# Patient Record
Sex: Male | Born: 1980
Health system: Southern US, Community
[De-identification: ages and names within clinical notes are randomized; demographics above are authoritative.]

## PROBLEM LIST (undated history)

## (undated) DIAGNOSIS — M109 Gout, unspecified: Secondary | ICD-10-CM

## (undated) HISTORY — DX: Gout, unspecified: M10.9

---

## 2016-04-10 ENCOUNTER — Emergency Department
Admission: EM | Admit: 2016-04-10 | Discharge: 2016-04-10 | Disposition: A | Discharge status: Home / Self Care | Payer: BLUE CROSS/BLUE SHIELD | Attending: Emergency Medicine | Admitting: Emergency Medicine

## 2016-04-10 ENCOUNTER — Emergency Department: Payer: BLUE CROSS/BLUE SHIELD

## 2016-04-10 DIAGNOSIS — M79643 Pain in unspecified hand: Secondary | ICD-10-CM

## 2016-04-10 DIAGNOSIS — R519 Headache, unspecified: Secondary | ICD-10-CM

## 2016-04-10 DIAGNOSIS — Y929 Unspecified place or not applicable: Secondary | ICD-10-CM | POA: Insufficient documentation

## 2016-04-10 DIAGNOSIS — Y999 Unspecified external cause status: Secondary | ICD-10-CM | POA: Insufficient documentation

## 2016-04-10 DIAGNOSIS — R51 Headache: Secondary | ICD-10-CM | POA: Diagnosis present

## 2016-04-10 DIAGNOSIS — Y939 Activity, unspecified: Secondary | ICD-10-CM | POA: Diagnosis not present

## 2016-04-10 DIAGNOSIS — W0110XA Fall on same level from slipping, tripping and stumbling with subsequent striking against unspecified object, initial encounter: Secondary | ICD-10-CM | POA: Diagnosis not present

## 2016-04-10 DIAGNOSIS — J069 Acute upper respiratory infection, unspecified: Secondary | ICD-10-CM | POA: Diagnosis not present

## 2016-04-10 DIAGNOSIS — R112 Nausea with vomiting, unspecified: Secondary | ICD-10-CM | POA: Insufficient documentation

## 2016-04-10 DIAGNOSIS — R42 Dizziness and giddiness: Secondary | ICD-10-CM | POA: Insufficient documentation

## 2016-04-10 DIAGNOSIS — M25542 Pain in joints of left hand: Secondary | ICD-10-CM | POA: Insufficient documentation

## 2016-04-10 LAB — URINALYSIS COMPLETE WITH MICROSCOPIC (ARMC ONLY)
BILIRUBIN URINE: NEGATIVE
Bacteria, UA: NONE SEEN
GLUCOSE, UA: NEGATIVE mg/dL
Hgb urine dipstick: NEGATIVE
Ketones, ur: NEGATIVE mg/dL
LEUKOCYTES UA: NEGATIVE
Nitrite: NEGATIVE
Protein, ur: NEGATIVE mg/dL
Specific Gravity, Urine: 1.016 (ref 1.005–1.030)
pH: 6 (ref 5.0–8.0)

## 2016-04-10 LAB — CBC
HCT: 45 % (ref 40.0–52.0)
HEMOGLOBIN: 15.5 g/dL (ref 13.0–18.0)
MCH: 30.6 pg (ref 26.0–34.0)
MCHC: 34.4 g/dL (ref 32.0–36.0)
MCV: 88.9 fL (ref 80.0–100.0)
Platelets: 201 10*3/uL (ref 150–440)
RBC: 5.06 MIL/uL (ref 4.40–5.90)
RDW: 13.2 % (ref 11.5–14.5)
WBC: 7.5 10*3/uL (ref 3.8–10.6)

## 2016-04-10 LAB — BASIC METABOLIC PANEL
ANION GAP: 7 (ref 5–15)
BUN: 18 mg/dL (ref 6–20)
CALCIUM: 9.5 mg/dL (ref 8.9–10.3)
CO2: 27 mmol/L (ref 22–32)
CREATININE: 1.17 mg/dL (ref 0.61–1.24)
Chloride: 106 mmol/L (ref 101–111)
GFR calc Af Amer: >60 mL/min (ref >60)
GLUCOSE: 109 mg/dL — AB (ref 65–99)
Potassium: 3.9 mmol/L (ref 3.5–5.1)
Sodium: 140 mmol/L (ref 135–145)

## 2016-04-10 MED ORDER — ONDANSETRON HCL 4 MG/2ML IJ SOLN
4.0000 mg | Freq: Once | INTRAMUSCULAR | Status: AC
  Administered 2016-04-10: 4 mg via INTRAVENOUS
  Filled 2016-04-10: qty 2

## 2016-04-10 MED ORDER — OXYMETAZOLINE HCL 0.05 % NA SOLN
1.0000 | Freq: Once | NASAL | Status: AC
  Administered 2016-04-10: 1 via NASAL
  Filled 2016-04-10: qty 15

## 2016-04-10 MED ORDER — ONDANSETRON HCL 4 MG PO TABS: 4.0000 mg | ORAL_TABLET | Freq: Three times a day (TID) | ORAL | 0 refills | Status: DC | PRN

## 2016-04-10 MED ORDER — OXYCODONE-ACETAMINOPHEN 5-325 MG PO TABS
1.0000 | ORAL_TABLET | Freq: Once | ORAL | Status: AC
  Administered 2016-04-10: 1 via ORAL
  Filled 2016-04-10: qty 1

## 2016-04-10 MED ORDER — MECLIZINE HCL 25 MG PO TABS: 25.0000 mg | ORAL_TABLET | Freq: Three times a day (TID) | ORAL | 0 refills | Status: DC | PRN

## 2016-04-10 MED ORDER — MECLIZINE HCL 25 MG PO TABS
25.0000 mg | ORAL_TABLET | Freq: Once | ORAL | Status: AC
  Administered 2016-04-10: 25 mg via ORAL
  Filled 2016-04-10: qty 1

## 2016-04-10 MED ORDER — SODIUM CHLORIDE 0.9 % IV BOLUS (SEPSIS): 500.0000 mL | Freq: Once | INTRAVENOUS | Status: DC

## 2016-04-10 MED ORDER — NAPROXEN SODIUM 275 MG PO TABS: 275.0000 mg | ORAL_TABLET | Freq: Two times a day (BID) | ORAL | 2 refills | Status: AC

## 2016-04-10 MED ORDER — MORPHINE SULFATE (PF) 4 MG/ML IV SOLN: 4.0000 mg | Freq: Once | INTRAVENOUS | Status: DC

## 2016-04-10 MED ORDER — SODIUM CHLORIDE 0.9 % IV BOLUS (SEPSIS)
1000.0000 mL | Freq: Once | INTRAVENOUS | Status: AC
Start: 2016-04-10 — End: 2016-04-10
  Administered 2016-04-10: 1000 mL via INTRAVENOUS

## 2016-04-10 MED ORDER — ONDANSETRON 4 MG PO TBDP
4.0000 mg | ORAL_TABLET | Freq: Once | ORAL | Status: AC | PRN
  Administered 2016-04-10: 4 mg via ORAL
  Filled 2016-04-10: qty 1

## 2016-04-10 MED ORDER — ONDANSETRON HCL 4 MG/2ML IJ SOLN: 4.0000 mg | Freq: Once | INTRAMUSCULAR | Status: DC

## 2016-04-10 NOTE — ED Provider Notes (Addendum)
[facility] Emergency Department Provider Note  ____________________________________________   I have reviewed the triage vital signs and the nursing notes.   HISTORY  Chief Complaint Nausea; Dizziness; Hand Pain; and Headache    HPI Benjamin Mccarty is a 35 y.o. male who is healthy at baseline. He has had, he states, many different episodes of true vertigo in his life. Usually, URIs. Patient states he's been having a URI for the last few days. He has been coughing. He states he has a history of sinus infections and sinus headaches. He states he is having sinus drainage in his sinuses feel "plugged up". He has pressure behind his sinuses which he describes the headache. This is a same sinus headache he has had multiple times. The patient states that he has had no fever. However, he does have vertigo. He states this is a same vertigo is had more times and he is able to recall usually associated with URIs. He has never seen anyone for this. Usually goes away after a few days. Denies any close head injury. States the other day in the middle the night he got up to go to the bathroom when he fell. He did not pass out but he banged his hand on the ground. He also has been vomiting. He states he has a cough. Usually his cough is somewhat post tussive. He does not usually vomit unless she is coughing. He is not coughing anything up. He denies any chest pain or shortness of breath. He denies any focal neurologic deficit. He has taken nothing for his headache today. Not worst headache of life. Gradual in onset. Symptoms for 6 days. Normal bowel movements.  History reviewed. No pertinent past medical history.  There are no active problems to display for this patient.   History reviewed. No pertinent surgical history.  Prior to Admission medications   Not on File    Allergies Patient has no known allergies.  No family history on file.  Social History Social History   Substance Use Topics  . Smoking status: Not on file  . Smokeless tobacco: Never Used  . Alcohol use No    Review of Systems }Constitutional: No fever/chills Eyes: No visual changes. ENT: No sore throat. No stiff neck no neck pain Cardiovascular: Denies chest pain. Respiratory: Denies shortness of breath.Positive cough Gastrointestinal:   Positive vomiting.  No diarrhea.  No constipation. Genitourinary: Negative for dysuria. Musculoskeletal: Negative lower extremity swelling Skin: Negative for rash. Neurological: Negative for severe headaches, focal weakness or numbness. 10-point ROS otherwise negative.  ____________________________________________   PHYSICAL EXAM:  VITAL SIGNS: ED Triage Vitals  Enc Vitals Group     BP 04/10/16 1317 124/90     Pulse Rate 04/10/16 1317 81     Resp 04/10/16 1317 18     Temp 04/10/16 1317 98 F (36.7 C)     Temp Source 04/10/16 1317 Oral     SpO2 04/10/16 1317 96 %     Weight 04/10/16 1318 288 lb (130.6 kg)     Height 04/10/16 1318 6' (1.829 m)     Head Circumference --      Peak Flow --      Pain Score 04/10/16 1318 8     Pain Loc --      Pain Edu? --      Excl. in GC? --     Constitutional: Alert and oriented. Well appearing and in no acute distress. Eyes: Conjunctivae are normal. PERRL. EOMI.  Head: Atraumatic. Nose: Positive swollen turbinates and erythema with rhinorrhea. Positive tenderness to palpation to the sinuses bilaterally. Mouth/Throat: Mucous membranes are moist.  Oropharynx non-erythematous. Mild cobblestoning from postnasal drip noted. Otherwise oropharynx normal Neck: No stridor.   Nontender with no meningismus Cardiovascular: Normal rate, regular rhythm. Grossly normal heart sounds.  Good peripheral circulation. Respiratory: Normal respiratory effort.  No retractions. Lungs CTAB. Abdominal: Soft and nontender. No distention. No guarding no rebound Back:  There is no focal tenderness or step off.  there is no  midline tenderness there are no lesions noted. there is no CVA tenderness Musculoskeletal: No lower extremity tenderness, there is minimal ptosis palpation to the left eye with no evidence of fracture no evidence of tenderness to the snuffbox, full range of motion no evidence of ligamentous injury. No joint effusions, no DVT signs strong distal pulses no edema Neurologic:  Cranial nerves II through XII are grossly intact 5 out of 5 strength bilateral upper and lower extremity. Finger to nose within normal limits heel to shin within normal limits, speech is normal with no word finding difficulty or dysarthria, reflexes symmetric, pupils are equally round and reactive to light, there is no pronator drift, sensation is normal, vision is intact to confrontation, gait is deferred, there is no nystagmus, normal neurologic exam, positive nystagmus. Skin:  Skin is warm, dry and intact. No rash noted. Port wine stain noted to the patient's left face. Psychiatric: Mood and affect are normal. Speech and behavior are normal.  ____________________________________________   LABS (all labs ordered are listed, but only abnormal results are displayed)  Labs Reviewed  BASIC METABOLIC PANEL - Abnormal; Notable for the following:       Result Value   Glucose, Bld 109 (*)    All other components within normal limits  URINALYSIS COMPLETEWITH MICROSCOPIC ([facility] ONLY) - Abnormal; Notable for the following:    Color, Urine YELLOW (*)    APPearance CLEAR (*)    Squamous Epithelial / LPF 0-5 (*)    All other components within normal limits  CBC   ____________________________________________  EKG  I personally interpreted any EKGs ordered by me or triage Normal EKG, normal sinus rhythm rate 77 bpm no acute ST elevation or acute ST depression ____________________________________________  RADIOLOGY  I reviewed any imaging ordered by me or triage that were performed during my shift and, if possible, patient and/or  family made aware of any abnormal findings. ____________________________________________   PROCEDURES  Procedure(s) performed: None  Procedures  Critical Care performed: None  ____________________________________________   INITIAL IMPRESSION / ASSESSMENT AND PLAN / ED COURSE  Pertinent labs & imaging results that were available during my care of the patient were reviewed by me and considered in my medical decision making (see chart for details).  Patient with a host of different complaints today, the most compelling for him is that he is having vertigo symptoms. He's had this multiple times in his life. It is fatigable. It is clearly brought on by viral syndromes. He's had them before. He is neurologically intact aside from a mild nystagmus. We will give him IV fluids, I will check a CT only because he has had multiple episodes of vertigo without any imaging,, but my anticipation is that it'll be negative. His TMs are normal bilaterally. I do not think that his headache represents meningitis or bleed. Do not think lumbar puncture is indicated. Patient is very reproducible sinus pain in the context of sinus drainage. We will give him Afrin  to see if we can open that up. We will give him Antivert, and we will continue to observe him. He may have a sprain him with is no evidence of significant injury will recommend orthopedic follow-up for persistent pain of such exists.   ----------------------------------------- 6:17 PM on 04/10/2016 -----------------------------------------  Patient in no acute distress at this time and vertigo is resolved, feels 100% better requesting discharge neurologically intact at discharge, lungs are clear, workup is very reassuring from top to bottom. EKG is reassuring blood work is reassuring x-rays are reassuring blood work is reassuring vital signs are reassuring neurologic exam is reassuring symptoms are reassuring and improvement in symptoms is reassuring.  Patient eager to be discharged, will send him with Antivert, we will advise Tylenol for the headaches from the nasal congestion. He and I discussed lumbar puncture, he would prefer not to have it done and I do not think it is warranted at this time. Return precautions and follow-up given and understood.  Clinical Course    ____________________________________________   FINAL CLINICAL IMPRESSION(S) / ED DIAGNOSES  Final diagnoses:  None      This chart was dictated using voice recognition software.  Despite best efforts to proofread,  errors can occur which can change meaning.      Jeanmarie PlantJames A Badr Piedra, MD 04/10/16 1648    Jeanmarie PlantJames A Brant Peets, MD 04/10/16 [phone]

## 2016-04-10 NOTE — ED Notes (Addendum)
Pt states frontal HA x few days, vomiting x 6 times yesterday. States dizziness. Pt also fell recently and hurt L hand, states he is not worried about that at this time. Pt states lights bother him, states some blurred vision. Denies abd pain, denies diarrhea, denies fever. Pt has strong cough. Denies having prescription for HA and denies seeing neurologist.

## 2016-04-10 NOTE — ED Triage Notes (Signed)
Pt arrives to ER via POV c/o URI sx X 5 days, nausea and dizziness X 2 days and hand injury due to trip and fall last night to left hand. Pt alert and oriented X4, active, cooperative, pt in NAD. RR even and unlabored, color WNL.

## 2017-05-12 IMAGING — CT CT HEAD W/O CM
3 series · 15 of 46 positions shown, 18 images · non-contrast
Comparison: None.

CLINICAL DATA: 35 y/o  M; headache and vertigo.

EXAM:
CT HEAD WITHOUT CONTRAST
TECHNIQUE: Contiguous axial images were obtained from the base of the skull
through the vertex without intravenous contrast.

[Series 2: head wo · axial · 0.41mm/px · z∈[-104,+16]mm · 9 of 29 slices shown, 12 images]
[im 3/29  brain]
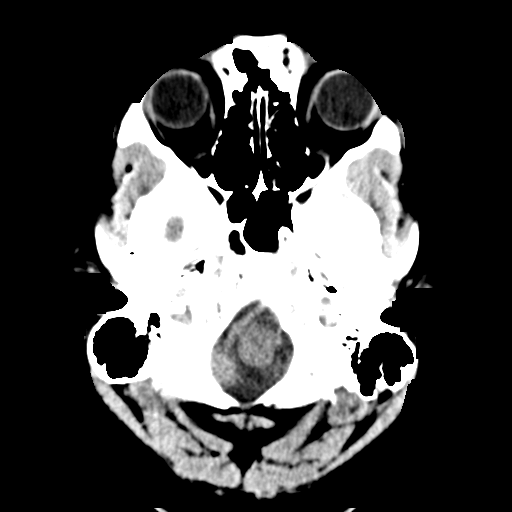
[im 3/29  bone]
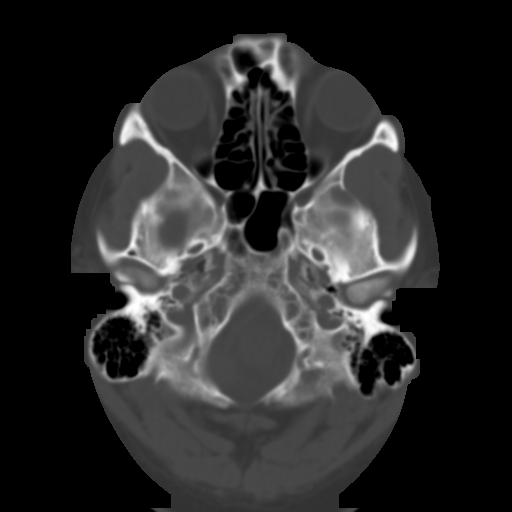
[im 6/29  brain]
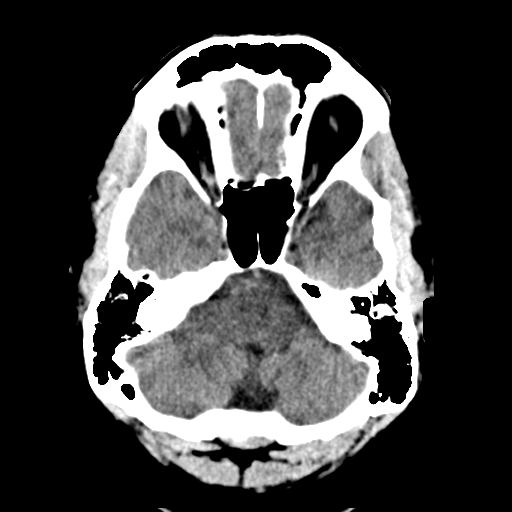
[im 9/29  brain]
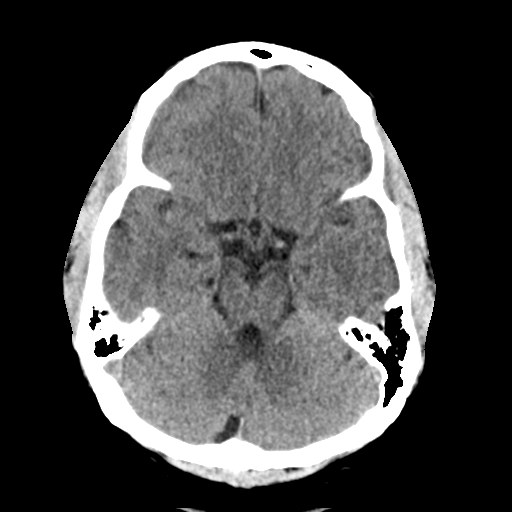
[im 12/29  brain]
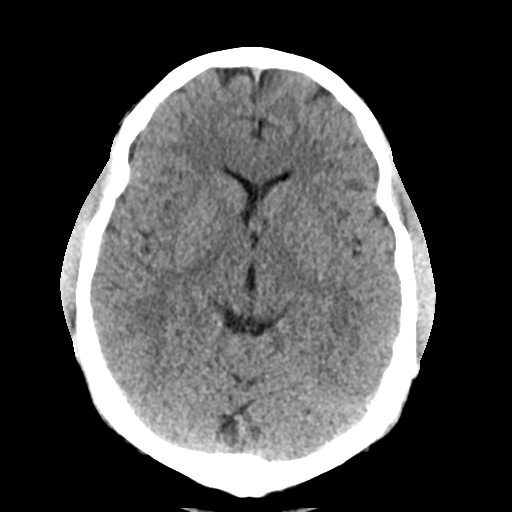
[im 15/29  brain]
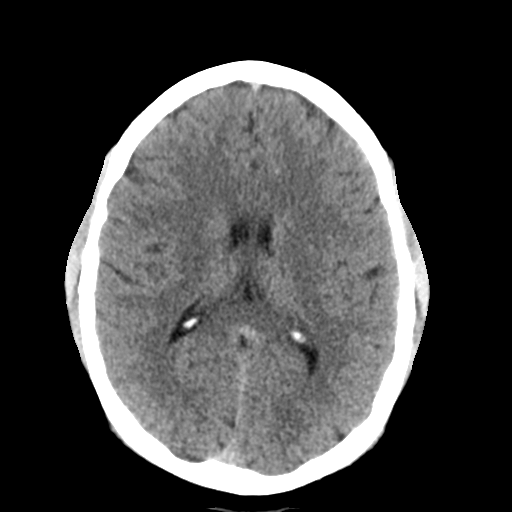
[im 15/29  bone]
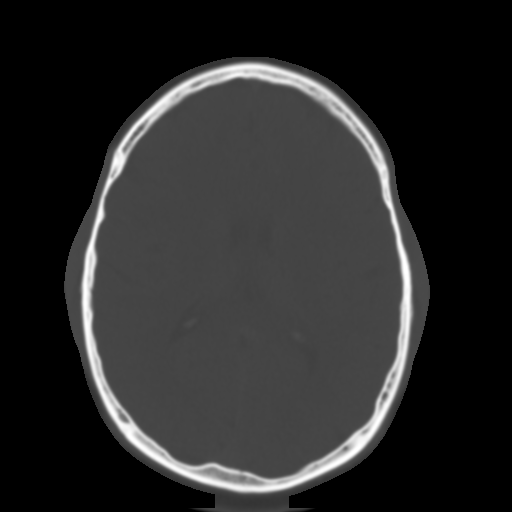
[im 18/29  brain]
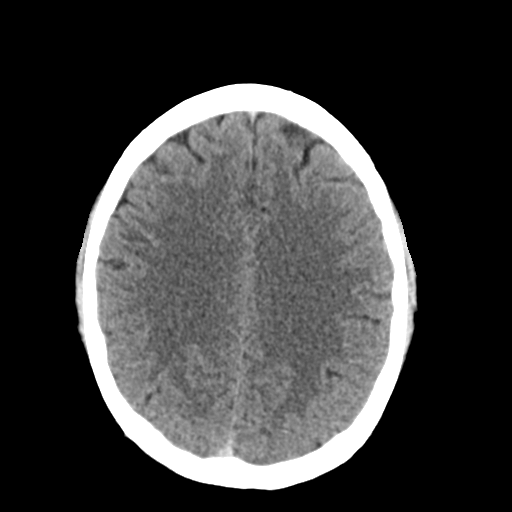
[im 21/29  brain]
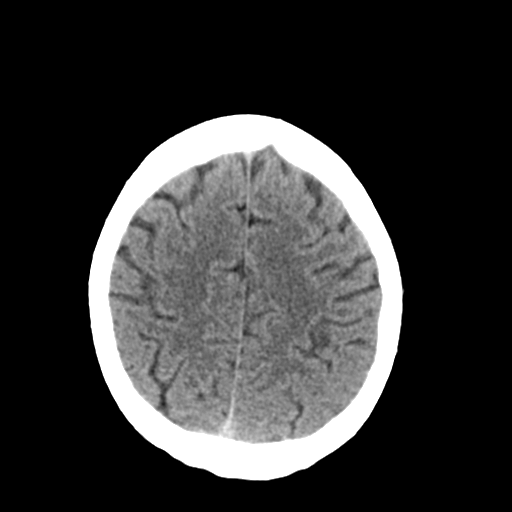
[im 24/29  brain]
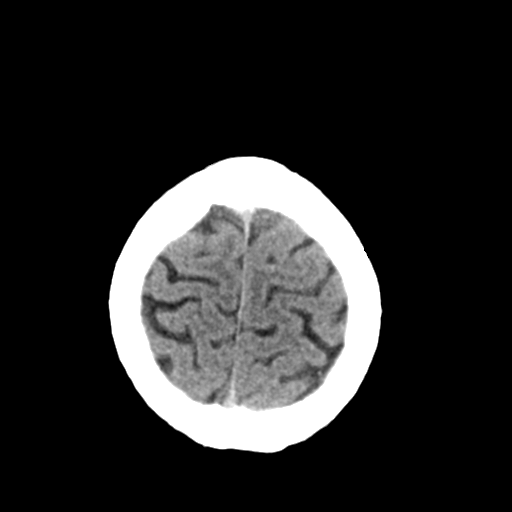
[im 27/29  brain]
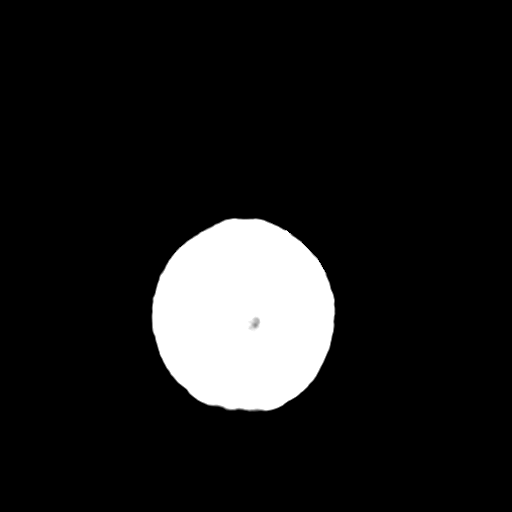
[im 27/29  bone]
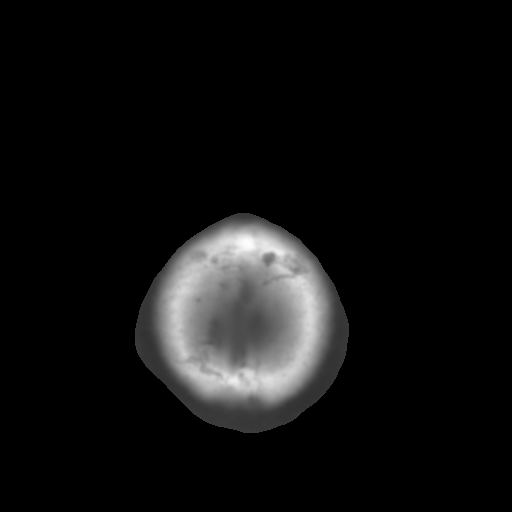

[Series 4: coronal soft tissue · coronal · 0.33mm/px · 3 of 65 slices shown]
[im 22/65  brain]
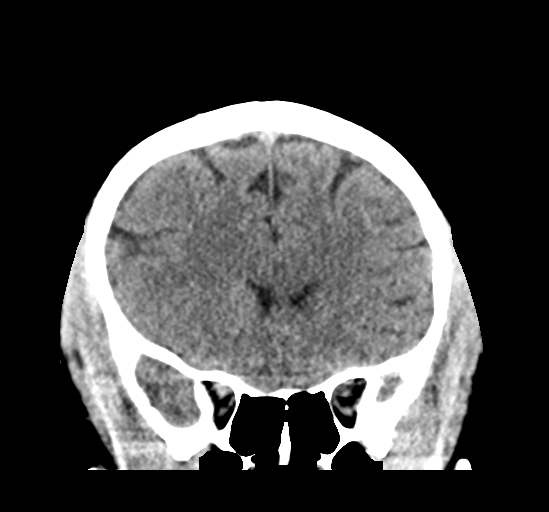
[im 29/65  brain]
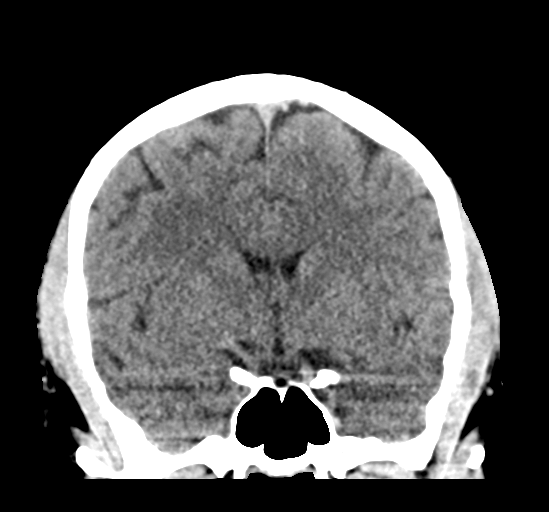
[im 36/65  brain]
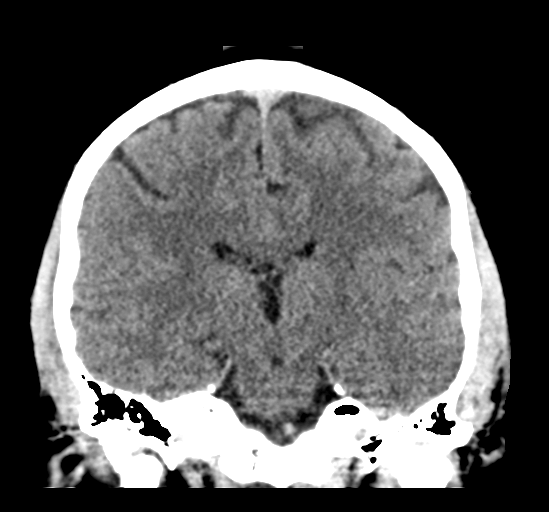

[Series 5: sagittal soft tissue · sagittal · 0.31mm/px · 3 of 53 slices shown]
[im 18/53  brain]
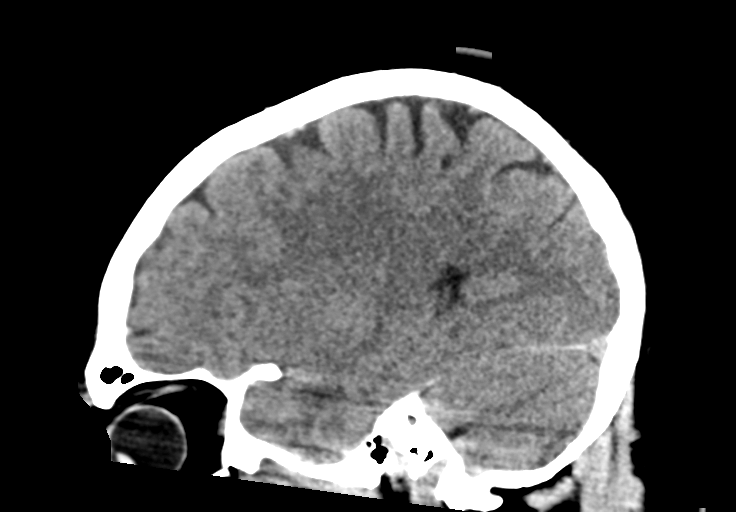
[im 27/53  brain]
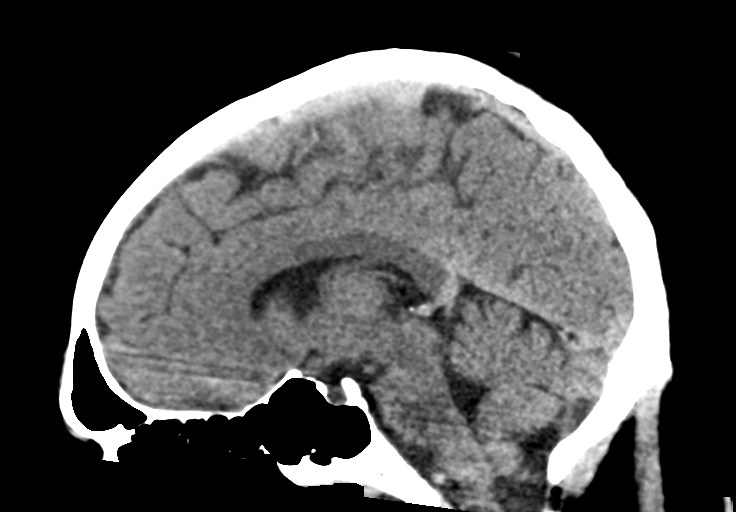
[im 35/53  brain]
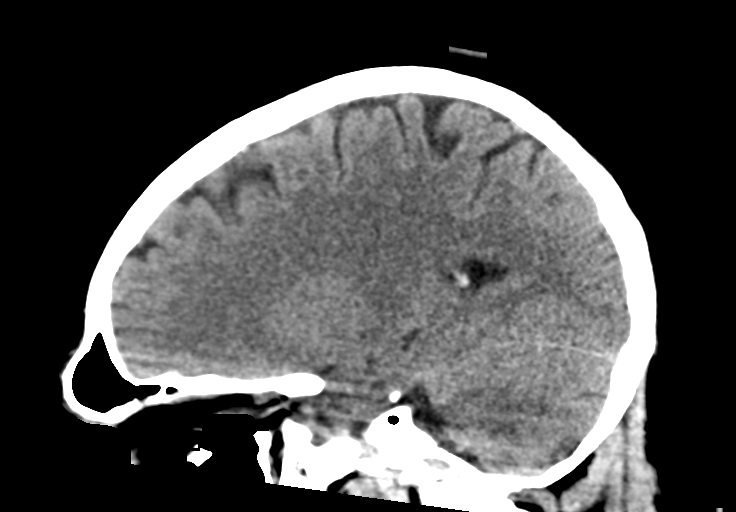

[15 of 46 positions shown; findings below may reference images not displayed]

FINDINGS: Brain: No evidence of acute infarction, hemorrhage, hydrocephalus,
extra-axial collection or mass lesion/mass effect.

Vascular: No hyperdense vessel or unexpected calcification.

Skull: Normal. Negative for fracture or focal lesion.

Sinuses/Orbits: No acute finding.

Other: None.
IMPRESSION: No acute intracranial abnormality.  Unremarkable CT of the head.

By: Mitsune Lauderdale M.D.

## 2017-05-12 IMAGING — CR DG HAND COMPLETE 3+V*L*
3 series · 3 of 3 positions shown · non-contrast
Comparison: None.

CLINICAL DATA: Dizziness with recent fall, pain in the fifth
metacarpal

EXAM:
LEFT HAND - COMPLETE 3+ VIEW

[hand ap]
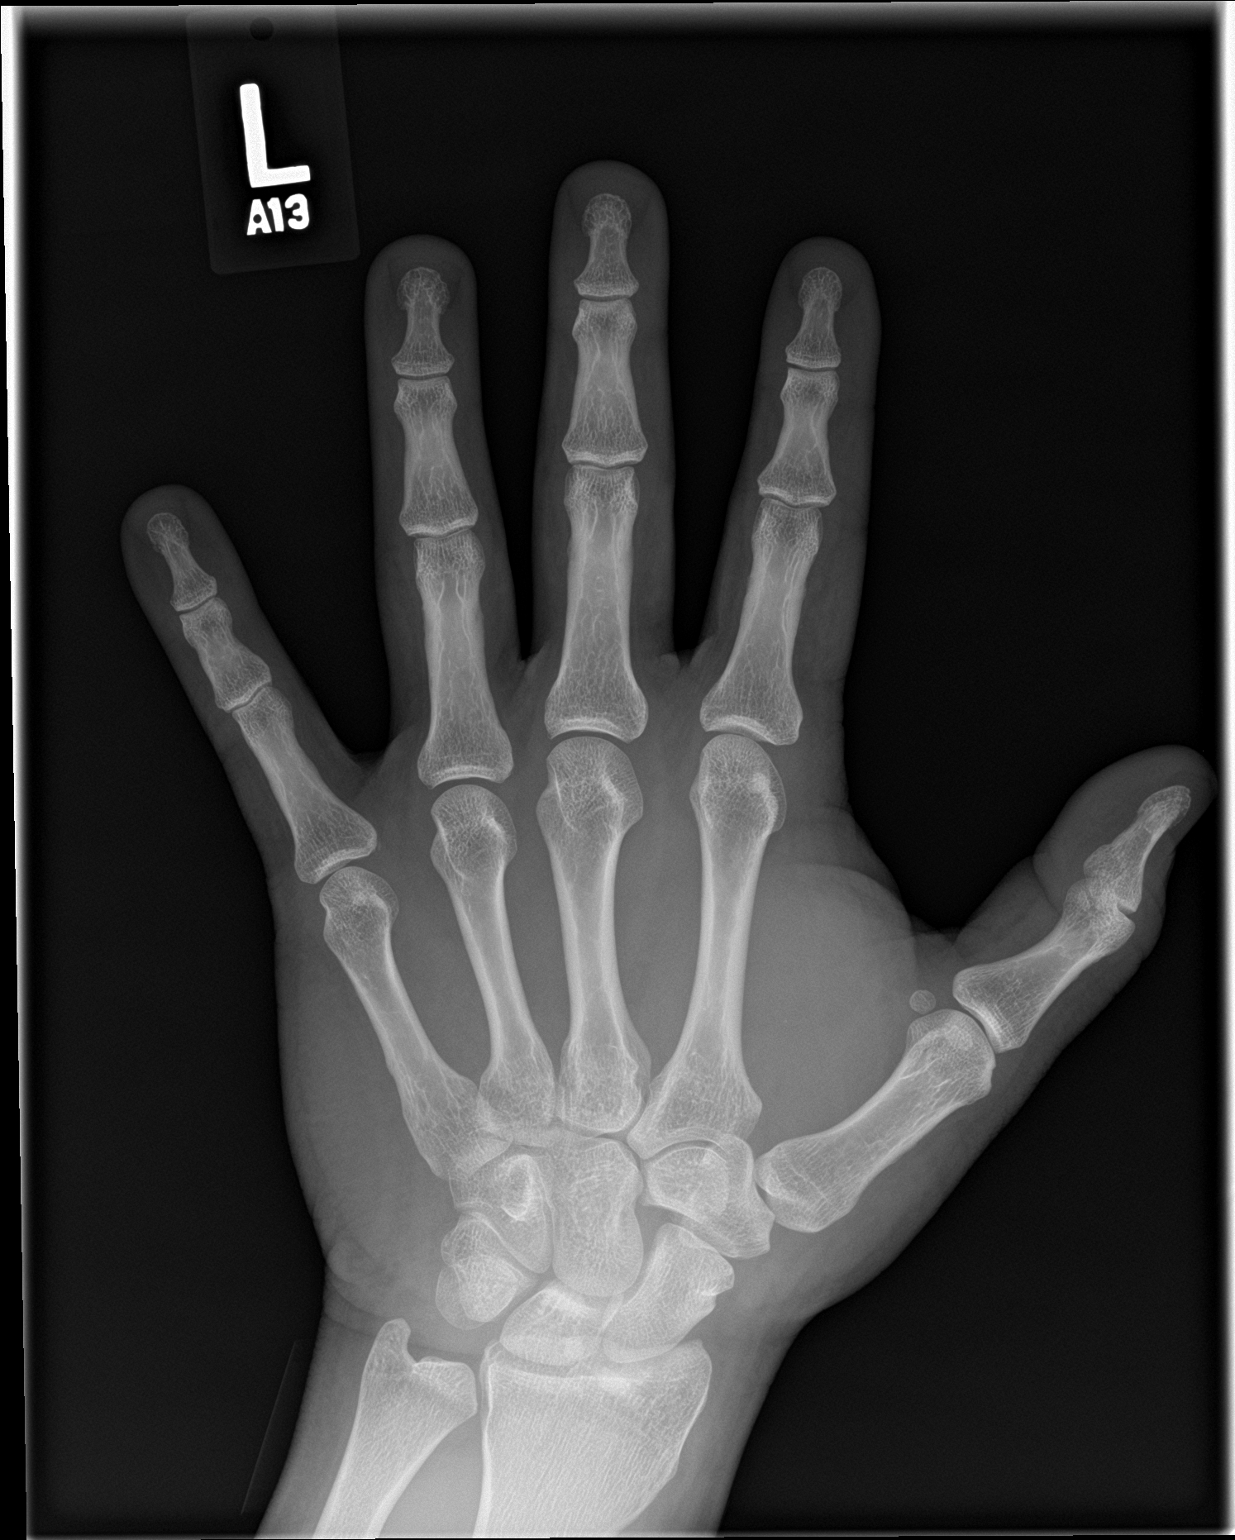

[hand obl]
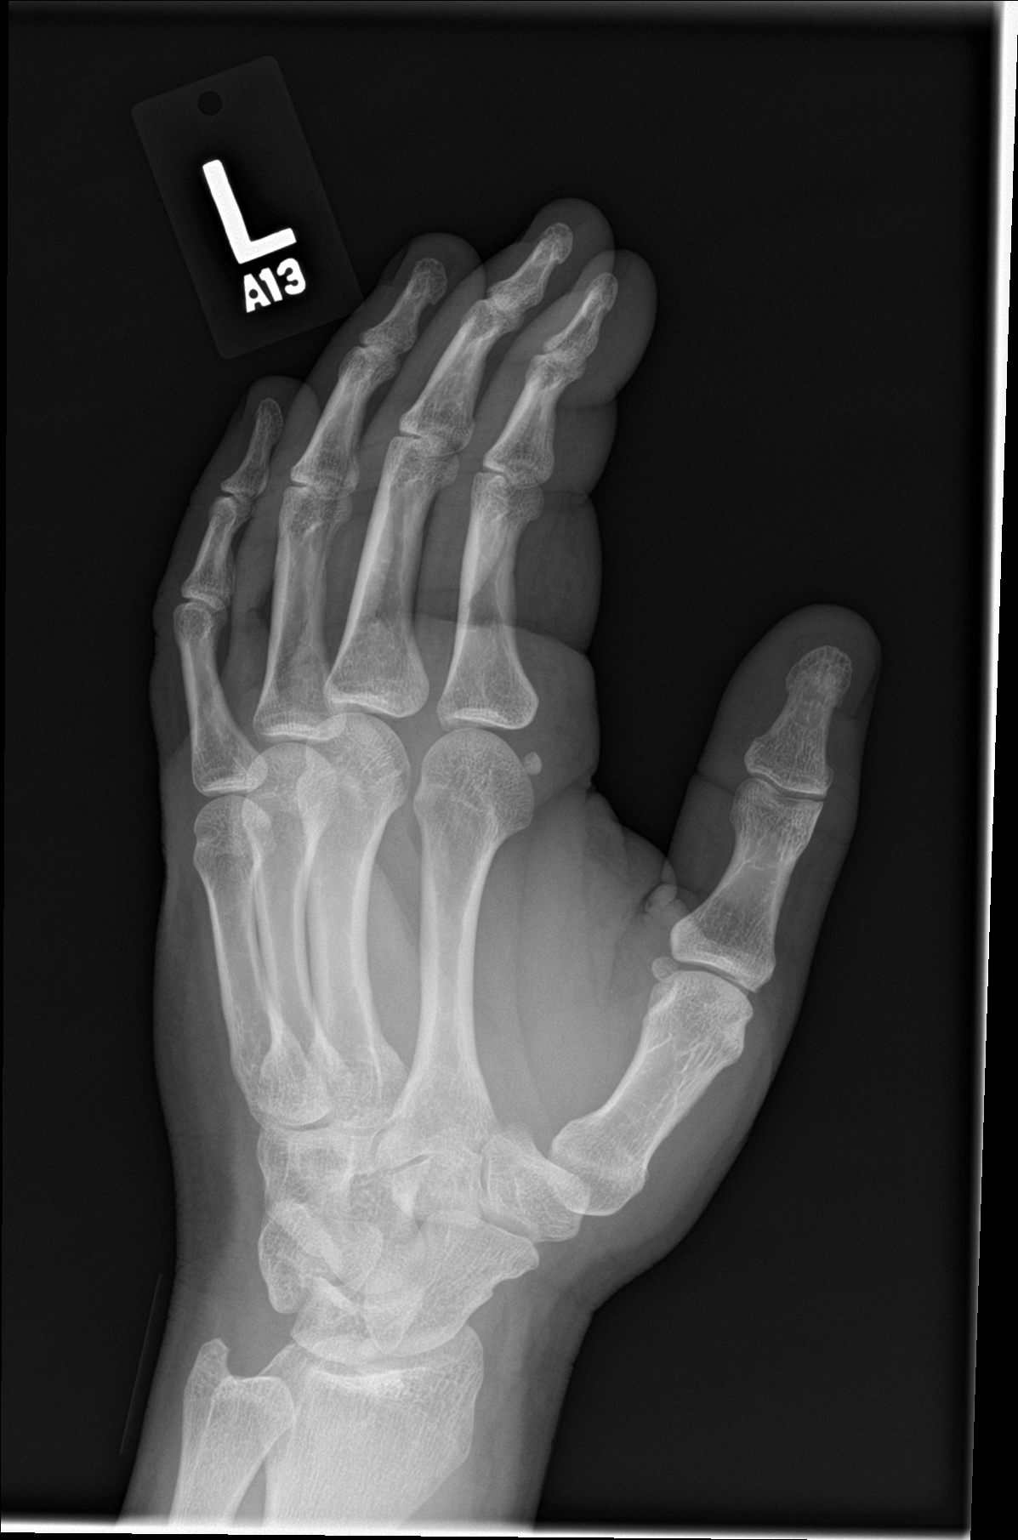

[hand lat]
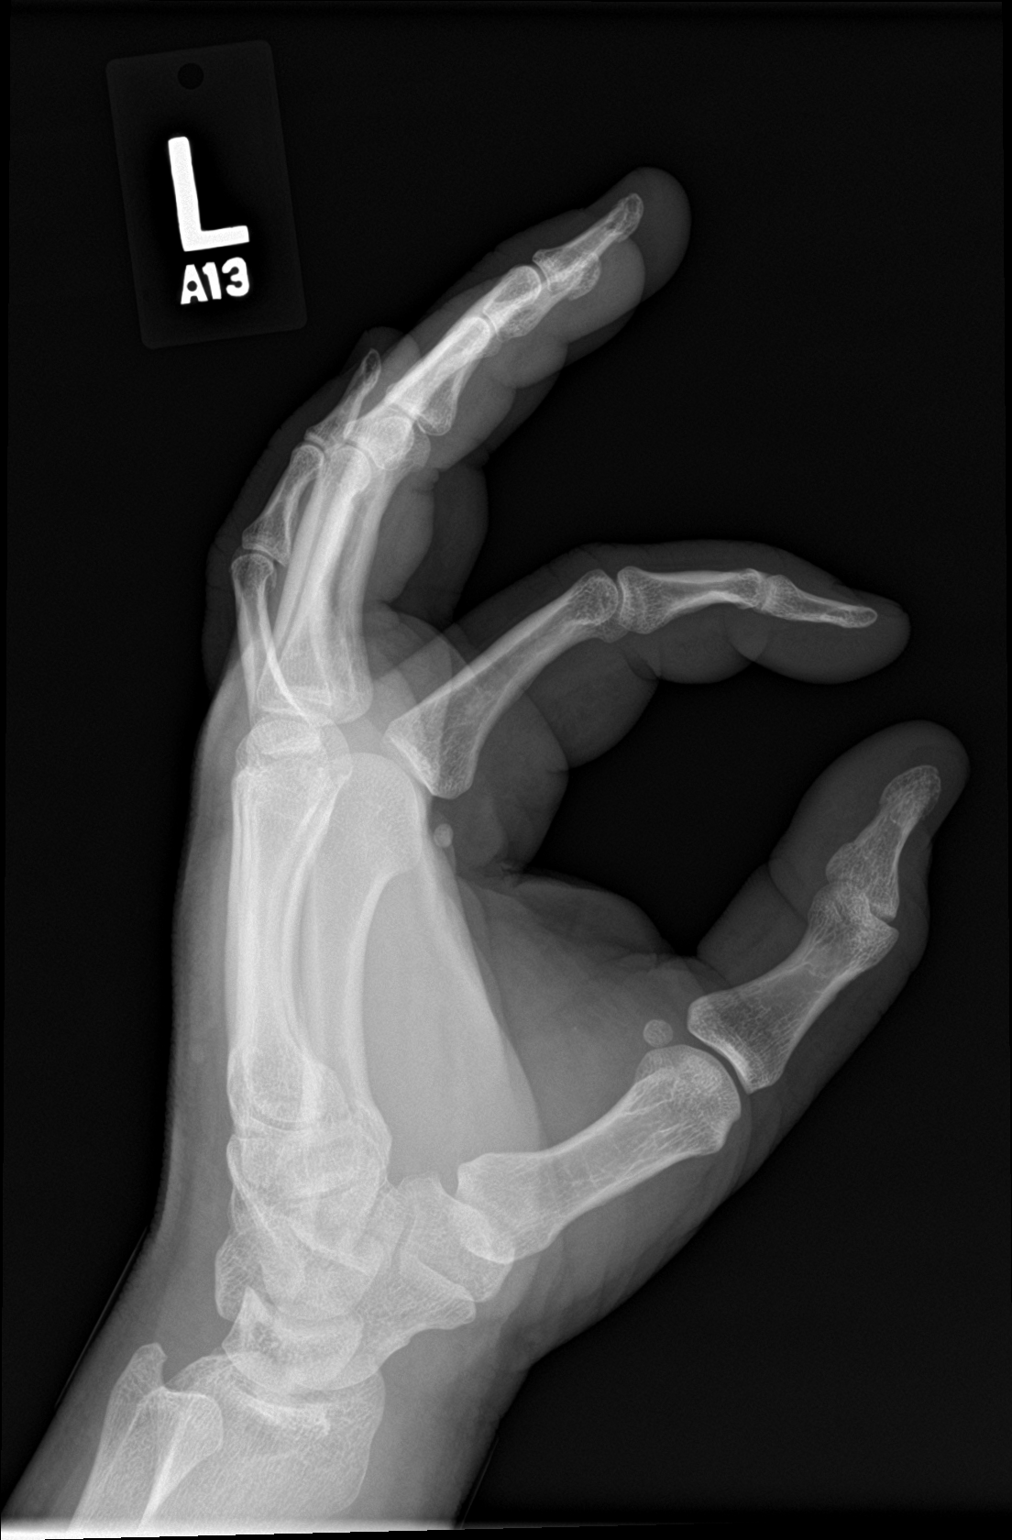

[3 of 3 positions shown; findings below may reference images not displayed]

FINDINGS: No acute fracture is seen. Alignment is normal. Joint spaces appear
normal. The radiocarpal joint space is unremarkable.
IMPRESSION: No fracture.

## 2018-02-02 DIAGNOSIS — M1A3720 Chronic gout due to renal impairment, left ankle and foot, without tophus (tophi): Secondary | ICD-10-CM | POA: Diagnosis not present

## 2018-02-05 DIAGNOSIS — M1A9XX0 Chronic gout, unspecified, without tophus (tophi): Secondary | ICD-10-CM | POA: Diagnosis not present

## 2018-06-23 DIAGNOSIS — M1A9XX0 Chronic gout, unspecified, without tophus (tophi): Secondary | ICD-10-CM | POA: Diagnosis not present

## 2018-08-04 DIAGNOSIS — M1A9XX0 Chronic gout, unspecified, without tophus (tophi): Secondary | ICD-10-CM | POA: Diagnosis not present

## 2018-08-07 ENCOUNTER — Emergency Department
Admission: EM | Admit: 2018-08-07 | Discharge: 2018-08-07 | Disposition: A | Discharge status: Home / Self Care | Payer: 59 | Attending: Emergency Medicine | Admitting: Emergency Medicine

## 2018-08-07 ENCOUNTER — Emergency Department: Payer: 59

## 2018-08-07 ENCOUNTER — Other Ambulatory Visit: Payer: Self-pay

## 2018-08-07 DIAGNOSIS — J181 Lobar pneumonia, unspecified organism: Secondary | ICD-10-CM | POA: Diagnosis not present

## 2018-08-07 DIAGNOSIS — J189 Pneumonia, unspecified organism: Secondary | ICD-10-CM | POA: Diagnosis not present

## 2018-08-07 DIAGNOSIS — R05 Cough: Secondary | ICD-10-CM | POA: Diagnosis not present

## 2018-08-07 DIAGNOSIS — R0602 Shortness of breath: Secondary | ICD-10-CM | POA: Diagnosis not present

## 2018-08-07 LAB — BASIC METABOLIC PANEL
ANION GAP: 10 (ref 5–15)
BUN: 18 mg/dL (ref 6–20)
CALCIUM: 9.1 mg/dL (ref 8.9–10.3)
CO2: 26 mmol/L (ref 22–32)
Chloride: 104 mmol/L (ref 98–111)
Creatinine, Ser: 1.21 mg/dL (ref 0.61–1.24)
GFR calc Af Amer: >60 mL/min (ref >60)
GLUCOSE: 117 mg/dL — AB (ref 70–99)
Potassium: 3.5 mmol/L (ref 3.5–5.1)
Sodium: 140 mmol/L (ref 135–145)

## 2018-08-07 LAB — CBC WITH DIFFERENTIAL/PLATELET
Abs Immature Granulocytes: 0.05 10*3/uL (ref 0.00–0.07)
BASOS PCT: 0 %
Basophils Absolute: 0 10*3/uL (ref 0.0–0.1)
EOS ABS: 0 10*3/uL (ref 0.0–0.5)
Eosinophils Relative: 0 %
HCT: 44.1 % (ref 39.0–52.0)
Hemoglobin: 14.9 g/dL (ref 13.0–17.0)
Immature Granulocytes: 0 %
Lymphocytes Relative: 7 %
Lymphs Abs: 0.9 10*3/uL (ref 0.7–4.0)
MCH: 30.2 pg (ref 26.0–34.0)
MCHC: 33.8 g/dL (ref 30.0–36.0)
MCV: 89.3 fL (ref 80.0–100.0)
MONOS PCT: 11 %
Monocytes Absolute: 1.5 10*3/uL — ABNORMAL HIGH (ref 0.1–1.0)
NRBC: 0 % (ref 0.0–0.2)
Neutro Abs: 11.6 10*3/uL — ABNORMAL HIGH (ref 1.7–7.7)
Neutrophils Relative %: 82 %
PLATELETS: 206 10*3/uL (ref 150–400)
RBC: 4.94 MIL/uL (ref 4.22–5.81)
RDW: 12.5 % (ref 11.5–15.5)
WBC: 14.2 10*3/uL — AB (ref 4.0–10.5)

## 2018-08-07 LAB — INFLUENZA PANEL BY PCR (TYPE A & B)
Influenza A By PCR: NEGATIVE
Influenza B By PCR: NEGATIVE

## 2018-08-07 LAB — PROCALCITONIN: Procalcitonin: <0.1 ng/mL

## 2018-08-07 MED ORDER — AZITHROMYCIN 250 MG PO TABS: ORAL_TABLET | ORAL | 0 refills | Status: DC

## 2018-08-07 MED ORDER — ACETAMINOPHEN 500 MG PO TABS
1000.0000 mg | ORAL_TABLET | ORAL | Status: AC
  Administered 2018-08-07: 1000 mg via ORAL
  Filled 2018-08-07: qty 2

## 2018-08-07 MED ORDER — AZITHROMYCIN 500 MG PO TABS
500.0000 mg | ORAL_TABLET | Freq: Once | ORAL | Status: AC
  Administered 2018-08-07: 500 mg via ORAL
  Filled 2018-08-07: qty 1

## 2018-08-07 MED ORDER — SODIUM CHLORIDE 0.9 % IV BOLUS
1000.0000 mL | Freq: Once | INTRAVENOUS | Status: AC
  Administered 2018-08-07: 1000 mL via INTRAVENOUS

## 2018-08-07 NOTE — Discharge Instructions (Addendum)
We believe that your symptoms are caused today by pneumonia, an infection in your lung(s).  Fortunately you should start to improve quickly after taking your antibiotics.  Please take the full course of antibiotics as prescribed and drink plenty of fluids.    Follow up with your doctor within 1-2 days.  If you develop any new or worsening symptoms, including but not limited to fever in spite of taking over-the-counter ibuprofen and/or Tylenol, persistent vomiting, worsening shortness of breath, or other symptoms that concern you, please return to the Emergency Department immediately.     ------------     Person Under Monitoring Name: Benjamin Mccarty  Location: [address] [place] [address]   Infection Prevention Recommendations for Individuals Confirmed to have, or Being Evaluated for, 2019 Novel Coronavirus (COVID-19) Infection Who Receive Care at Home  Individuals who are confirmed to have, or are being evaluated for, COVID-19 should follow the prevention steps below until a healthcare provider or local or state health department says they can return to normal activities.  Stay home except to get medical care You should restrict activities outside your home, except for getting medical care. Do not go to work, school, or public areas, and do not use public transportation or taxis.  Call ahead before visiting your doctor Before your medical appointment, call the healthcare provider and tell them that you have, or are being evaluated for, COVID-19 infection. This will help the healthcare providers office take steps to keep other people from getting infected. Ask your healthcare provider to call the local or state health department.  Monitor your symptoms Seek prompt medical attention if your illness is worsening (e.g., difficulty breathing). Before going to your medical appointment, call the healthcare provider and tell them that you have, or are being evaluated for, COVID-19  infection. Ask your healthcare provider to call the local or state health department.  Wear a facemask You should wear a facemask that covers your nose and mouth when you are in the same room with other people and when you visit a healthcare provider. People who live with or visit you should also wear a facemask while they are in the same room with you.  Separate yourself from other people in your home As much as possible, you should stay in a different room from other people in your home. Also, you should use a separate bathroom, if available.  Avoid sharing household items You should not share dishes, drinking glasses, cups, eating utensils, towels, bedding, or other items with other people in your home. After using these items, you should wash them thoroughly with soap and water.  Cover your coughs and sneezes Cover your mouth and nose with a tissue when you cough or sneeze, or you can cough or sneeze into your sleeve. Throw used tissues in a lined trash can, and immediately wash your hands with soap and water for at least 20 seconds or use an alcohol-based hand rub.  Wash your [employer] your hands often and thoroughly with soap and water for at least 20 seconds. You can use an alcohol-based hand sanitizer if soap and water are not available and if your hands are not visibly dirty. Avoid touching your eyes, nose, and mouth with unwashed hands.   Prevention Steps for Caregivers and Household Members of Individuals Confirmed to have, or Being Evaluated for, COVID-19 Infection Being Cared for in the Home  If you live with, or provide care at home for, a person confirmed to  have, or being evaluated for, COVID-19 infection please follow these guidelines to prevent infection:  Follow healthcare providers instructions Make sure that you understand and can help the patient follow any healthcare provider instructions for all care.  Provide for the patients basic needs You should  help the patient with basic needs in the home and provide support for getting groceries, prescriptions, and other personal needs.  Monitor the patients symptoms If they are getting sicker, call his or her medical provider and tell them that the patient has, or is being evaluated for, COVID-19 infection. This will help the healthcare providers office take steps to keep other people from getting infected. Ask the healthcare provider to call the local or state health department.  Limit the number of people who have contact with the patient If possible, have only one caregiver for the patient. Other household members should stay in another home or place of residence. If this is not possible, they should stay in another room, or be separated from the patient as much as possible. Use a separate bathroom, if available. Restrict visitors who do not have an essential need to be in the home.  Keep older adults, very young children, and other sick people away from the patient Keep older adults, very young children, and those who have compromised immune systems or chronic health conditions away from the patient. This includes people with chronic heart, lung, or kidney conditions, diabetes, and cancer.  Ensure good ventilation Make sure that shared spaces in the home have good air flow, such as from an air conditioner or an opened window, weather permitting.  Wash your hands often Wash your hands often and thoroughly with soap and water for at least 20 seconds. You can use an alcohol based hand sanitizer if soap and water are not available and if your hands are not visibly dirty. Avoid touching your eyes, nose, and mouth with unwashed hands. Use disposable paper towels to dry your hands. If not available, use dedicated cloth towels and replace them when they become wet.  Wear a facemask and gloves Wear a disposable facemask at all times in the room and gloves when you touch or have contact with the  patients blood, body fluids, and/or secretions or excretions, such as sweat, saliva, sputum, nasal mucus, vomit, urine, or feces.  Ensure the mask fits over your nose and mouth tightly, and do not touch it during use. Throw out disposable facemasks and gloves after using them. Do not reuse. Wash your hands immediately after removing your facemask and gloves. If your personal clothing becomes contaminated, carefully remove clothing and launder. Wash your hands after handling contaminated clothing. Place all used disposable facemasks, gloves, and other waste in a lined container before disposing them with other household waste. Remove gloves and wash your hands immediately after handling these items.  Do not share dishes, glasses, or other household items with the patient Avoid sharing household items. You should not share dishes, drinking glasses, cups, eating utensils, towels, bedding, or other items with a patient who is confirmed to have, or being evaluated for, COVID-19 infection. After the person uses these items, you should wash them thoroughly with soap and water.  Wash laundry thoroughly Immediately remove and wash clothes or bedding that have blood, body fluids, and/or secretions or excretions, such as sweat, saliva, sputum, nasal mucus, vomit, urine, or feces, on them. Wear gloves when handling laundry from the patient. Read and follow directions on labels of laundry or clothing items and  detergent. In general, wash and dry with the warmest temperatures recommended on the label.  Clean all areas the individual has used often Clean all touchable surfaces, such as counters, tabletops, doorknobs, bathroom fixtures, toilets, phones, keyboards, tablets, and bedside tables, every day. Also, clean any surfaces that may have blood, body fluids, and/or secretions or excretions on them. Wear gloves when cleaning surfaces the patient has come in contact with. Use a diluted bleach solution (e.g.,  dilute bleach with 1 part bleach and 10 parts water) or a household disinfectant with a label that says EPA-registered for coronaviruses. To make a bleach solution at home, add 1 tablespoon of bleach to 1 quart (4 cups) of water. For a larger supply, add  cup of bleach to 1 gallon (16 cups) of water. Read labels of cleaning products and follow recommendations provided on product labels. Labels contain instructions for safe and effective use of the cleaning product including precautions you should take when applying the product, such as wearing gloves or eye protection and making sure you have good ventilation during use of the product. Remove gloves and wash hands immediately after cleaning.  Monitor yourself for signs and symptoms of illness Caregivers and household members are considered close contacts, should monitor their health, and will be asked to limit movement outside of the home to the extent possible. Follow the monitoring steps for close contacts listed on the symptom monitoring form.   ? If you have additional questions, contact your local health department or call the epidemiologist on call at [phone] (available 24/7). ? This guidance is subject to change. For the most up-to-date guidance from [facility], please refer to their website: [employer]hu

## 2018-08-07 NOTE — ED Triage Notes (Signed)
Pt reports started yesterday with fever and chills. States he works out of state in [place] came back to [place] last night. States little contact with others but stays in a hotel. States now having wheezing and shortness of breath.

## 2018-08-07 NOTE — ED Provider Notes (Signed)
[facility] Emergency Department Provider Note   ____________________________________________   First MD Initiated Contact with Patient 08/07/18 2014     (approximate)  I have reviewed the triage vital signs and the nursing notes.   HISTORY  Chief Complaint Fever and Shortness of Breath    HPI Benjamin Mccarty is a 38 y.o. male reports no major past medical history other than gout  About a day ago he began experiencing fevers chills body aches.  Developed a rather dry nonproductive cough.  No travel history outside the [place].  No known contact with anyone with coronavirus.  Feels like he has "the flu"  Some slight shortness of breath when he is up walking.  No fevers chills.  Has not taken any medicine for this yet.  Reports that he just feels like he is come down with the flu.  No shortness of breath at rest.  No recent hospitalizations.  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  No recent antibiotic treatments.  Denies being on any immunosuppressants.   No past medical history on file.  There are no active problems to display for this patient.   No past surgical history on file.  Prior to Admission medications   Medication Sig Start Date End Date Taking? Authorizing Provider  azithromycin (ZITHROMAX Z-PAK) 250 MG tablet 1 tab daily 08/07/18   Sharyn Creamer, MD  meclizine (ANTIVERT) 25 MG tablet Take 1 tablet (25 mg total) by mouth 3 (three) times daily as needed. 04/10/16   Jeanmarie Plant, MD  ondansetron (ZOFRAN) 4 MG tablet Take 1 tablet (4 mg total) by mouth every 8 (eight) hours as needed for nausea or vomiting. 04/10/16   Jeanmarie Plant, MD    Allergies Patient has no known allergies.  No family history on file.  Social History Social History   Tobacco Use  . Smoking status: Not on file  . Smokeless tobacco: Never Used  Substance Use Topics  . Alcohol use: No  . Drug use: Not on file    Review of Systems Constitutional: Fevers  and chills and some fatigue but no lightheadedness. Eyes: No visual changes. ENT: No sore throat. Cardiovascular: Denies chest pain. Respiratory: Denies shortness of breath except a little bit when he is walking distances, and dry cough is present.  Felt like he had a just a slight amount of wheezing earlier that is gone away.  Does not have a history of asthma. Gastrointestinal: No abdominal pain.   Genitourinary: Negative for dysuria. Musculoskeletal: Negative for back pain. Skin: Negative for rash. Neurological: Negative for headaches, areas of focal weakness or numbness.    ____________________________________________   PHYSICAL EXAM:  VITAL SIGNS: ED Triage Vitals  Enc Vitals Group     BP --      Pulse Rate 08/07/18 1937 (!) 105     Resp 08/07/18 1937 18     Temp 08/07/18 1937 (!) 100.8 F (38.2 C)     Temp Source 08/07/18 1937 Oral     SpO2 08/07/18 1937 97 %     Weight 08/07/18 1938 294 lb (133.4 kg)     Height 08/07/18 1938 6' (1.829 m)     Head Circumference --      Peak Flow --      Pain Score 08/07/18 1938 4     Pain Loc --      Pain Edu? --      Excl. in GC? --     Constitutional: Alert  and oriented.  Mildly ill-appearing but very pleasant and in no distress.  Speaks in full clear sentences without any labored breathing. Eyes: Conjunctivae are normal. Head: Atraumatic.  Chronic birthmark over the left face. Nose: No congestion/rhinnorhea. Mouth/Throat: Mucous membranes are moist. Neck: No stridor.  Cardiovascular: Very minimally tachycardic, regular rhythm. Grossly normal heart sounds.  Good peripheral circulation. Respiratory: Normal respiratory effort.  No retractions. Lungs CTAB.  Using.  Speaks full clear sentences without distress. Gastrointestinal: Soft and nontender. No distention. Musculoskeletal: No lower extremity tenderness nor edema. Neurologic:  Normal speech and language. No gross focal neurologic deficits are appreciated.  Skin:  Skin is  warm, dry and intact. No rash noted. Psychiatric: Mood and affect are normal. Speech and behavior are normal.  ____________________________________________   LABS (all labs ordered are listed, but only abnormal results are displayed)  Labs Reviewed  CBC WITH DIFFERENTIAL/PLATELET - Abnormal; Notable for the following components:      Result Value   WBC 14.2 (*)    Neutro Abs 11.6 (*)    Monocytes Absolute 1.5 (*)    All other components within normal limits  BASIC METABOLIC PANEL - Abnormal; Notable for the following components:   Glucose, Bld 117 (*)    All other components within normal limits  CULTURE, BLOOD (ROUTINE X 2)  CULTURE, BLOOD (ROUTINE X 2)  PROCALCITONIN  INFLUENZA PANEL BY PCR (TYPE A & B)  LACTIC ACID, PLASMA   ____________________________________________  EKG   ____________________________________________  RADIOLOGY  Dg Chest Portable 1 View  Result Date: 08/07/2018 CLINICAL DATA:  Fever, cough, shortness of breath. EXAM: PORTABLE CHEST 1 VIEW COMPARISON:  04/10/2016 FINDINGS: Moderate sized right suprahilar opacity. Background bronchial thickening which is chronic. Unchanged heart size and mediastinal contours. No pleural effusion or pneumothorax. No acute osseous abnormality. IMPRESSION: Moderate right suprahilar opacity consistent with pneumonia in the setting of cough and fever. Background bronchial thickening is chronic and unchanged from 2017. Electronically Signed   By: Narda Rutherford M.D.   On: 08/07/2018 20:33    Chest x-ray reviewed, suprahilar pneumonia noted. ____________________________________________   PROCEDURES  Procedure(s) performed: None  Procedures  Critical Care performed: No  ____________________________________________   INITIAL IMPRESSION / ASSESSMENT AND PLAN / ED COURSE  Pertinent labs & imaging results that were available during my care of the patient were reviewed by me and considered in my medical decision making  (see chart for details).     Patient presents with infectious upper respiratory type symptoms.  Chest x-ray does have an infiltrate.  Differential diagnosis includes community-acquired pneumonia, influenza, coronavirus, less likely atypical.  He is resting quite comfortably just slightly tachycardic will provide antipyretic, fluids and reassessment.  He overall looks well and nontoxic at this point with normal oxygen saturation and work of breathing.  Appears appropriate for initiation of azithromycin.  Will send lab work in the setting of further evaluation to delineate if this could represent coronavirus, though at the present time with extreme limitations in the number of tests available we cannot test him if plan it outpatient management (which appears appropriate)  ----------------------------------------- 10:16 PM on 08/07/2018 -----------------------------------------  Appears most consistent with community-acquired pneumonia.  His white blood cell count is elevated, he does have neutorphilia.  Flu test is negative.  Procalcitonin is low.  He is doing well, after fluids his vital signs have normalized except for low-grade fever.  He feels improved.  Patient is comfortable with the plan for discharge and I discussed careful return precautions  and will treat with additional 4 days of azithromycin.  Patient in agreement with this plan.  Nontoxic, well-appearing appropriate for discharge at this time  ANTONIOUS OMAHONEY was evaluated in Emergency Department on 08/07/2018 for the symptoms described in the history of present illness. He was evaluated in the context of the global COVID-19 pandemic, which necessitated consideration that the patient might be at risk for infection with the SARS-CoV-2 virus that causes COVID-19. Institutional protocols and algorithms that pertain to the evaluation of patients at risk for COVID-19 are in a state of rapid change based on information released by regulatory bodies  including the CDC and federal and state organizations. These policies and algorithms were followed during the patient's care in the ED.    Return precautions and treatment recommendations and follow-up discussed with the patient who is agreeable with the plan.   ____________________________________________   FINAL CLINICAL IMPRESSION(S) / ED DIAGNOSES  Final diagnoses:  Community acquired pneumonia of right upper lobe of lung (HCC)        Note:  This document was prepared using Conservation officer, historic buildings and may include unintentional dictation errors       Sharyn Creamer, MD 08/07/18 2218

## 2018-08-12 LAB — CULTURE, BLOOD (ROUTINE X 2)
Culture: NO GROWTH
Culture: NO GROWTH
SPECIAL REQUESTS: ADEQUATE
Special Requests: ADEQUATE

## 2018-09-27 DIAGNOSIS — M10071 Idiopathic gout, right ankle and foot: Secondary | ICD-10-CM | POA: Diagnosis not present

## 2018-10-30 ENCOUNTER — Ambulatory Visit: Payer: 59 | Admitting: Family Medicine

## 2018-11-11 DIAGNOSIS — J0191 Acute recurrent sinusitis, unspecified: Secondary | ICD-10-CM | POA: Diagnosis not present

## 2018-12-18 ENCOUNTER — Ambulatory Visit: Payer: 59 | Admitting: Family Medicine

## 2019-02-26 ENCOUNTER — Encounter: Payer: Self-pay | Admitting: Family Medicine

## 2019-02-26 ENCOUNTER — Other Ambulatory Visit: Payer: Self-pay

## 2019-02-26 ENCOUNTER — Ambulatory Visit (INDEPENDENT_AMBULATORY_CARE_PROVIDER_SITE_OTHER): Payer: Managed Care, Other (non HMO) | Admitting: Family Medicine

## 2019-02-26 VITALS — BP 121/75 | HR 75 | Temp 98.6°F | Resp 16 | Ht 72.0 in | Wt 269.0 lb

## 2019-02-26 DIAGNOSIS — M2550 Pain in unspecified joint: Secondary | ICD-10-CM

## 2019-02-26 DIAGNOSIS — G8929 Other chronic pain: Secondary | ICD-10-CM

## 2019-02-26 DIAGNOSIS — R7309 Other abnormal glucose: Secondary | ICD-10-CM | POA: Diagnosis not present

## 2019-02-26 DIAGNOSIS — Z8261 Family history of arthritis: Secondary | ICD-10-CM | POA: Diagnosis not present

## 2019-02-26 DIAGNOSIS — M129 Arthropathy, unspecified: Secondary | ICD-10-CM

## 2019-02-26 DIAGNOSIS — E669 Obesity, unspecified: Secondary | ICD-10-CM | POA: Diagnosis not present

## 2019-02-26 DIAGNOSIS — Z8269 Family history of other diseases of the musculoskeletal system and connective tissue: Secondary | ICD-10-CM

## 2019-02-26 DIAGNOSIS — Q825 Congenital non-neoplastic nevus: Secondary | ICD-10-CM

## 2019-02-26 DIAGNOSIS — Z8739 Personal history of other diseases of the musculoskeletal system and connective tissue: Secondary | ICD-10-CM

## 2019-02-26 MED ORDER — INDOMETHACIN 50 MG PO CAPS: 50.0000 mg | ORAL_CAPSULE | Freq: Two times a day (BID) | ORAL | 2 refills | Status: DC | PRN

## 2019-02-26 MED ORDER — ALLOPURINOL 300 MG PO TABS: 300.0000 mg | ORAL_TABLET | Freq: Every day | ORAL | 1 refills | Status: DC

## 2019-02-26 NOTE — Assessment & Plan Note (Signed)
Stable chronic problem without any known complications

## 2019-02-26 NOTE — Progress Notes (Signed)
Subjective:    Patient ID: Benjamin Mccarty, male    DOB: 11/13/1980, 38 y.o.   MRN: [id number]  Benjamin Mccarty is a 38 y.o. male presenting on 02/26/2019 for Establish Care (gout)  Previous PCP Dr Bernita Buffy in [place]. He wishes to switch PCP to our office now.  HPI  History of Gout Reports chronic gout flare ups occasionally, R ankle or other ankle and toe. History of prior injury to ankles in past in teenage years. On Allopurinol '300mg'$  daily, he failed '100mg'$  in past with still gout flares. Takes Indocin as needed for flare up. Or occasionally prednisone for severe flare. He is overdue for blood for Uric Acid  Chronic Joint Pain / Shoulders, Knees, Ankles Reports chronic history of being "careless" with body in his younger 53s, he has done a lot of physical work with heavy lifting he says, believes some wear and tear. 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 - Worse on cloudy/rainy days, aching episodic, moderate to severe. Some days with muscles tense and sore and aching as well as joints. Not every day but most days. - Fam history of rheumatoid arthritis on both sides, and Lupus in mother. - Trial of different muscle relaxants in past without good results. - Trial of OTC NSAIDs, Meloxicam, Naproxen, Tylenol. Temporary relief on meloxicam. - No prior lab work or imaging x-rays for these conditions  Multiple Lipomas / Episodic Pains Reports chronic problem with 3 main spots that he has been dx with lipoma before left flank r flank and central chest below mid ribcage, feels a bump in these locations usually not painful but one on left rib wall can be painful if lay on it wrong at times.  Seasonal Allergies  Health Maintenance:  Family history Colon CA - father, age 91. He has not had colonoscopy, asking about this. Asymptomatic currently. His 2 older brothers had colonoscopy  negative.  Depression screen PHQ 2/9 02/26/2019  Decreased Interest 0  Down, Depressed, Hopeless 0  PHQ - 2 Score 0    Past Medical History:  Diagnosis Date  . Gout    History reviewed. No pertinent surgical history. Social History   Socioeconomic History  . Marital status: Married    Spouse name: Not on file  . Number of children: Not on file  . Years of education: Not on file  . Highest education level: Not on file  Occupational History  . Not on file  Social Needs  . Financial resource strain: Not on file  . Food insecurity    Worry: Not on file    Inability: Not on file  . Transportation needs    Medical: Not on file    Non-medical: Not on file  Tobacco Use  . Smoking status: Never Smoker  . Smokeless tobacco: Never Used  Substance and Sexual Activity  . Alcohol use: No  . Drug use: Never  . Sexual activity: Not on file  Lifestyle  . Physical activity    Days per week: Not on file    Minutes per session: Not on file  . Stress: Not on file  Relationships  . Social Herbalist on phone: Not on file    Gets together: Not on file    Attends religious service: Not on file    Active member of club or organization: Not on file    Attends  meetings of clubs or organizations: Not on file    Relationship status: Not on file  . Intimate partner violence    Fear of current or ex partner: Not on file    Emotionally abused: Not on file    Physically abused: Not on file    Forced sexual activity: Not on file  Other Topics Concern  . Not on file  Social History Narrative  . Not on file   Family History  Problem Relation Age of Onset  . Thyroid disease Mother   . Lupus Mother   . Diabetes Mother   . Rheum arthritis Mother   . Diabetes Father   . Colon cancer Father 28  . Rheum arthritis Maternal Grandmother   . Rheum arthritis Maternal Great-grandmother    No current outpatient medications on file prior to visit.   No current facility-administered  medications on file prior to visit.     Review of Systems Per HPI unless specifically indicated above     Objective:    BP 121/75   Pulse 75   Temp 98.6 F (37 C) (Oral)   Resp 16   Ht 6' (1.829 m)   Wt 269 lb (122 kg)   BMI 36.48 kg/m   Wt Readings from Last 3 Encounters:  02/26/19 269 lb (122 kg)  08/07/18 294 lb (133.4 kg)  04/10/16 288 lb (130.6 kg)    Physical Exam Vitals signs and nursing note reviewed.  Constitutional:      General: He is not in acute distress.    Appearance: He is well-developed. He is not diaphoretic.     Comments: Well-appearing, comfortable, cooperative, obese  HENT:     Head: Normocephalic and atraumatic.  Eyes:     General:        Right eye: No discharge.        Left eye: No discharge.     Conjunctiva/sclera: Conjunctivae normal.  Neck:     Musculoskeletal: Normal range of motion and neck supple.     Thyroid: No thyromegaly.  Cardiovascular:     Rate and Rhythm: Normal rate and regular rhythm.     Heart sounds: Normal heart sounds. No murmur.  Pulmonary:     Effort: Pulmonary effort is normal. No respiratory distress.     Breath sounds: Normal breath sounds. No wheezing or rales.  Musculoskeletal: Normal range of motion.     Comments: Palpable smooth well defined moderate size 3-5 cm nodular density likely lipoma, located left lateral ribcage lower and also R side smaller, and one at xiphoid process area of chest wall  Left knee mild crepitus. R knee no crepitus Good ROM active of both knees. No obvious joint deformity or edema or erythema of knees. Hands without obvious deformity or asymmetry.  Lymphadenopathy:     Cervical: No cervical adenopathy.  Skin:    General: Skin is warm and dry.     Findings: No erythema or rash.     Comments: Left face with port wine stain across eye area, nasal, and cheek  Neurological:     Mental Status: He is alert and oriented to person, place, and time.  Psychiatric:        Behavior: Behavior  normal.     Comments: Well groomed, good eye contact, normal speech and thoughts       Results for orders placed or performed during the hospital encounter of 08/07/18  Blood Culture (routine x 2)   Specimen: BLOOD  Result Value Ref  Range   Specimen Description BLOOD LEFT ANTECUBITAL    Special Requests      BOTTLES DRAWN AEROBIC AND ANAEROBIC Blood Culture adequate volume   Culture      NO GROWTH 5 DAYS Performed at [facility], [address] [place], [place] [address]    Report Status 08/12/2018 FINAL   Blood Culture (routine x 2)   Specimen: BLOOD  Result Value Ref Range   Specimen Description BLOOD RIGHT ANTECUBITAL    Special Requests      BOTTLES DRAWN AEROBIC AND ANAEROBIC Blood Culture adequate volume   Culture      NO GROWTH 5 DAYS Performed at [facility], [address] [place], [place] [address]    Report Status 08/12/2018 FINAL   CBC with Differential  Result Value Ref Range   WBC 14.2 (H) 4.0 - 10.5 K/uL   RBC 4.94 4.22 - 5.81 MIL/uL   Hemoglobin 14.9 13.0 - 17.0 g/dL   HCT 44.1 39.0 - 52.0 %   MCV 89.3 80.0 - 100.0 fL   MCH 30.2 26.0 - 34.0 pg   MCHC 33.8 30.0 - 36.0 g/dL   RDW 12.5 11.5 - 15.5 %   Platelets 206 150 - 400 K/uL   nRBC 0.0 0.0 - 0.2 %   Neutrophils Relative % 82 %   Neutro Abs 11.6 (H) 1.7 - 7.7 K/uL   Lymphocytes Relative 7 %   Lymphs Abs 0.9 0.7 - 4.0 K/uL   Monocytes Relative 11 %   Monocytes Absolute 1.5 (H) 0.1 - 1.0 K/uL   Eosinophils Relative 0 %   Eosinophils Absolute 0.0 0.0 - 0.5 K/uL   Basophils Relative 0 %   Basophils Absolute 0.0 0.0 - 0.1 K/uL   Immature Granulocytes 0 %   Abs Immature Granulocytes 0.05 0.00 - 0.07 K/uL  Basic metabolic panel  Result Value Ref Range   Sodium 140 135 - 145 mmol/L   Potassium 3.5 3.5 - 5.1 mmol/L   Chloride 104 98 - 111 mmol/L   CO2 26 22 - 32 mmol/L   Glucose, Bld 117 (H) 70 - 99 mg/dL   BUN 18 6 - 20 mg/dL   Creatinine, Ser 1.21 0.61 - 1.24 mg/dL    Calcium 9.1 8.9 - 10.3 mg/dL   GFR calc non Af Amer >60 >60 mL/min   GFR calc Af Amer >60 >60 mL/min   Anion gap 10 5 - 15  Procalcitonin - Baseline  Result Value Ref Range   Procalcitonin <0.10 ng/mL  Influenza panel by PCR (type A & B)  Result Value Ref Range   Influenza A By PCR NEGATIVE NEGATIVE   Influenza B By PCR NEGATIVE NEGATIVE      Assessment & Plan:   Problem List Items Addressed This Visit    Arthritis, multiple joint involvement   Relevant Orders   Sed Rate (ESR)   C-reactive protein   Cyclic citrul peptide antibody, IgG   Rheumatoid Factor   CBC with Differential/Platelet   COMPLETE METABOLIC PANEL WITH GFR   ANA   History of gout    Stable chronic problem episodic flares ankles/toe with gout No recent uric acid level On Allopurinol '300mg'$  daily with good results prophylaxis Taking Indocin PRN flare, may use prednisone PRN for flare  Refill meds, counseling on gout, future lab within 1 week for uric acid He can continue allopurinol during flare if need instead of temporarily stop Consider rheumatology and other evaluation if indicated  Relevant Medications   indomethacin (INDOCIN) 50 MG capsule   allopurinol (ZYLOPRIM) 300 MG tablet   Other Relevant Orders   Uric acid   Obesity (BMI 35.0-39.9 without comorbidity)   Relevant Orders   Hemoglobin A1c   COMPLETE METABOLIC PANEL WITH GFR   Port-wine stain of face    Stable chronic problem without any known complications       Other Visit Diagnoses    Chronic joint pain    -  Primary   Relevant Medications   indomethacin (INDOCIN) 50 MG capsule   Family history of rheumatoid arthritis       Relevant Orders   Sed Rate (ESR)   C-reactive protein   Cyclic citrul peptide antibody, IgG   Rheumatoid Factor   Family history of systemic lupus erythematosus       Relevant Orders   ANA   Abnormal glucose       Relevant Orders   Hemoglobin A1c      #arthritis multiple joints Clinically with mixed  features of chronic pain / arthritis, has some features of osteoarthritis clinically and also some concern for possible RA with joint involvement, earlier age, family history and also concern for possible fibromuscular or neuropathic pain involvement. - Strong fam history of RA, Lupus - Limited prior work-up  Proceed with extensive lab work up inflammatory markers, ANA, RF CCP Future anticipate X-ray imaging of several joints including knees, upper back/neck, lumbar spine  Consider medication options pending work up such as duloxetine/cymbalta, gabapentin for possible chronic/neuropathy also can help arthritic pain or fibromyalgia if ultimately work up is otherwise negative.  May refer to Rheumatology for further eval and management if lab abnormality or for 2nd opinion.  F/u with results notify patient and consider follow up options.  Meds ordered this encounter  Medications  . indomethacin (INDOCIN) 50 MG capsule    Sig: Take 1 capsule (50 mg total) by mouth 2 (two) times daily as needed for moderate pain (gout). For up to 5-7 days as needed for gout flare    Dispense:  30 capsule    Refill:  2  . allopurinol (ZYLOPRIM) 300 MG tablet    Sig: Take 1 tablet (300 mg total) by mouth daily.    Dispense:  90 tablet    Refill:  1    Follow up plan: Return in about 4 weeks (around 03/26/2019), or if symptoms worsen or fail to improve.  Nobie Putnam, DO [facility] 02/26/2019, 10:22 AM

## 2019-02-26 NOTE — Patient Instructions (Addendum)
Thank you for coming to the office today.  Ordered extensive lab panel including rheumatoid, lupus, gout, chemistry, kidney, diabetes testing.  Refilled medicines. Take as needed.  Future consideration other meds - Gabapentin, Duloxetine (Cymbalta) - consider these options in the future.  - Gout is a chronic problem that will have episodic flare ups with pain, redness, swelling of a joint, most common spots are big toe, foot and ankle, knee or sometimes hands or wrists. It is caused by small crystals made of Uric Acid that form in the joint causing pain and swelling.  Use Indocin as needed if gout flare.  If pain does not significantly improve within 48 to 72 hours, please notify us and we can send in a Prednisone burst instead  then once resolved, then start the preventative Colchicine dose of 1 pill a day for 1 month.  For all gout flares, the sooner you start the medication, then the shorter the flare lasts. Go ahead and start taking Indomethicin or Colchicine as soon as you get significant gout pain and swelling again in the future, and if it is not improving within 48 hours then you can follow-up at our office. OR if you don't start medication you can come to the office within 24-48 hours for treatment here.  Gout flares can repeat again soon after they resolve in the same spot or other joints, and may need repeat treatment.  Our goal is to prevent future gout flares. Try to avoid dietary triggers that are the most common causes of gout flares. - Avoid the following foods/drinks: - Red meat, organ meat (liver) - Alcohol (especially beer, also wine, liquor) - Processed foods / carbs (white bread, white rice, pasta, sugar) - Sugary drinks (sweet tea, soda) - Shellfish, shrimp / lobster  - Foods that are preferred to eat: - Beans, Lentils, Whole grains, Quinoa - Fruits, Vegetables - Dairy, Cheese, Yogurt - Soy based protein   Please schedule a Follow-up Appointment to: Return in  about 4 weeks (around 03/26/2019), or if symptoms worsen or fail to improve.  If you have any other questions or concerns, please feel free to call the office or send a message through [facility]. You may also schedule an earlier appointment if necessary.  Additionally, you may be receiving a survey about your experience at our office within a few days to 1 week by e-mail or mail. We value your feedback.  Nobie Putnam, DO [facility]

## 2019-02-26 NOTE — Assessment & Plan Note (Addendum)
Stable chronic problem episodic flares ankles/toe with gout No recent uric acid level On Allopurinol 300mg  daily with good results prophylaxis Taking Indocin PRN flare, may use prednisone PRN for flare  Refill meds, counseling on gout, future lab within 1 week for uric acid He can continue allopurinol during flare if need instead of temporarily stop Consider rheumatology and other evaluation if indicated

## 2019-05-25 DIAGNOSIS — Z20828 Contact with and (suspected) exposure to other viral communicable diseases: Secondary | ICD-10-CM | POA: Diagnosis not present

## 2019-06-02 DIAGNOSIS — Z20828 Contact with and (suspected) exposure to other viral communicable diseases: Secondary | ICD-10-CM | POA: Diagnosis not present

## 2019-06-05 DIAGNOSIS — Z20828 Contact with and (suspected) exposure to other viral communicable diseases: Secondary | ICD-10-CM | POA: Diagnosis not present

## 2019-06-08 DIAGNOSIS — Z20828 Contact with and (suspected) exposure to other viral communicable diseases: Secondary | ICD-10-CM | POA: Diagnosis not present

## 2019-08-27 ENCOUNTER — Other Ambulatory Visit: Payer: Self-pay

## 2019-08-27 ENCOUNTER — Encounter: Payer: Self-pay | Admitting: Family Medicine

## 2019-08-27 ENCOUNTER — Other Ambulatory Visit: Payer: Self-pay | Admitting: Family Medicine

## 2019-08-27 ENCOUNTER — Ambulatory Visit (INDEPENDENT_AMBULATORY_CARE_PROVIDER_SITE_OTHER): Payer: BC Managed Care – PPO | Admitting: Family Medicine

## 2019-08-27 VITALS — BP 118/69 | HR 75 | Temp 97.3°F | Resp 16 | Ht 72.0 in | Wt 276.0 lb

## 2019-08-27 DIAGNOSIS — R7309 Other abnormal glucose: Secondary | ICD-10-CM

## 2019-08-27 DIAGNOSIS — Z8739 Personal history of other diseases of the musculoskeletal system and connective tissue: Secondary | ICD-10-CM

## 2019-08-27 DIAGNOSIS — E669 Obesity, unspecified: Secondary | ICD-10-CM

## 2019-08-27 DIAGNOSIS — Z8261 Family history of arthritis: Secondary | ICD-10-CM

## 2019-08-27 DIAGNOSIS — S2341XA Sprain of ribs, initial encounter: Secondary | ICD-10-CM | POA: Diagnosis not present

## 2019-08-27 DIAGNOSIS — Z8269 Family history of other diseases of the musculoskeletal system and connective tissue: Secondary | ICD-10-CM

## 2019-08-27 DIAGNOSIS — M129 Arthropathy, unspecified: Secondary | ICD-10-CM

## 2019-08-27 MED ORDER — CYCLOBENZAPRINE HCL 10 MG PO TABS: 5.0000 mg | ORAL_TABLET | Freq: Three times a day (TID) | ORAL | 1 refills | Status: DC | PRN

## 2019-08-27 MED ORDER — INDOMETHACIN 50 MG PO CAPS: 50.0000 mg | ORAL_CAPSULE | Freq: Two times a day (BID) | ORAL | 3 refills | Status: DC | PRN

## 2019-08-27 NOTE — Progress Notes (Signed)
Subjective:    Patient ID: Benjamin Mccarty, male    DOB: 02/22/1981, 39 y.o.   MRN: [id number]  Benjamin Mccarty is a 39 y.o. male presenting on 08/27/2019 for Back Pain (lower left side onset 2 days)  Patient presents for a same day appointment.  HPI   Acute Left Low Back / Flank Pain Recently had severe cough few month ago in February 2021. He had very severe coughing spell and he felt a "snap or a rubber band pop" had severe pain Left lower flank/back, had pain for several days and overall had done well since. It resolved and he did well. - Earlier this week he climbed up to back of a truck and he lifted himself up and had severe pain in Left low back exact same spot sudden pain. Has been persistent for past 2 days now, constant pain worse with movement pressing on it worse with laying down at night on it. Worse with cough - Tried Salonpas, heating pad. - Off today, no work note needed Denies any fall injury trauma, numbness tingling radiating pain, shortness of breath wheezing, worse coughing fever chills  Hx Gout Request refill on indomethicin. Asking about colchicine prices and future repeat gout lab test.  Also had rheumatological labs ordered back in 02/2019, but he had to cancel, now wants to re-schedule for lab work  History of [place] in January, lost his job and divorced.    Depression screen PHQ 2/9 02/26/2019  Decreased Interest 0  Down, Depressed, Hopeless 0  PHQ - 2 Score 0    Social History   Tobacco Use  . Smoking status: Never Smoker  . Smokeless tobacco: Never Used  Substance Use Topics  . Alcohol use: No  . Drug use: Never    Review of Systems Per HPI unless specifically indicated above     Objective:    BP 118/69   Pulse 75   Temp (!) 97.3 F (36.3 C) (Temporal)   Resp 16   Ht 6' (1.829 m)   Wt 276 lb (125.2 kg)   BMI 37.43 kg/m   Wt Readings from Last 3 Encounters:  08/27/19 276 lb (125.2 kg)  02/26/19 269 lb (122 kg)  08/07/18 294  lb (133.4 kg)    Physical Exam Vitals and nursing note reviewed.  Constitutional:      General: He is not in acute distress.    Appearance: He is well-developed. He is not diaphoretic.     Comments: Well-appearing, comfortable, cooperative  HENT:     Head: Normocephalic and atraumatic.  Eyes:     General:        Right eye: No discharge.        Left eye: No discharge.     Conjunctiva/sclera: Conjunctivae normal.  Cardiovascular:     Rate and Rhythm: Normal rate.  Pulmonary:     Effort: Pulmonary effort is normal. No respiratory distress.     Breath sounds: Normal breath sounds. No wheezing or rales.  Musculoskeletal:     Comments: Localized Left low back flank pain mostly lateral over rib angle. Tender reproduced pain at this spot. No ecchymosis. No spinal tenderness. Some localized muscle hypertonicity. Has full range of motion  Skin:    General: Skin is warm and dry.     Findings: No erythema or rash.  Neurological:     Mental Status: He is alert and oriented to person, place, and time.  Psychiatric:  Behavior: Behavior normal.     Comments: Well groomed, good eye contact, normal speech and thoughts       Results for orders placed or performed during the hospital encounter of 08/07/18  Blood Culture (routine x 2)   Specimen: BLOOD  Result Value Ref Range   Specimen Description BLOOD LEFT ANTECUBITAL    Special Requests      BOTTLES DRAWN AEROBIC AND ANAEROBIC Blood Culture adequate volume   Culture      NO GROWTH 5 DAYS Performed at [facility], [address] [place], [place] [address]    Report Status 08/12/2018 FINAL   Blood Culture (routine x 2)   Specimen: BLOOD  Result Value Ref Range   Specimen Description BLOOD RIGHT ANTECUBITAL    Special Requests      BOTTLES DRAWN AEROBIC AND ANAEROBIC Blood Culture adequate volume   Culture      NO GROWTH 5 DAYS Performed at [facility], [address] [place], [place] [address]     Report Status 08/12/2018 FINAL   CBC with Differential  Result Value Ref Range   WBC 14.2 (H) 4.0 - 10.5 K/uL   RBC 4.94 4.22 - 5.81 MIL/uL   Hemoglobin 14.9 13.0 - 17.0 g/dL   HCT 98.3 38.2 - 50.5 %   MCV 89.3 80.0 - 100.0 fL   MCH 30.2 26.0 - 34.0 pg   MCHC 33.8 30.0 - 36.0 g/dL   RDW 39.7 67.3 - 41.9 %   Platelets 206 150 - 400 K/uL   nRBC 0.0 0.0 - 0.2 %   Neutrophils Relative % 82 %   Neutro Abs 11.6 (H) 1.7 - 7.7 K/uL   Lymphocytes Relative 7 %   Lymphs Abs 0.9 0.7 - 4.0 K/uL   Monocytes Relative 11 %   Monocytes Absolute 1.5 (H) 0.1 - 1.0 K/uL   Eosinophils Relative 0 %   Eosinophils Absolute 0.0 0.0 - 0.5 K/uL   Basophils Relative 0 %   Basophils Absolute 0.0 0.0 - 0.1 K/uL   Immature Granulocytes 0 %   Abs Immature Granulocytes 0.05 0.00 - 0.07 K/uL  Basic metabolic panel  Result Value Ref Range   Sodium 140 135 - 145 mmol/L   Potassium 3.5 3.5 - 5.1 mmol/L   Chloride 104 98 - 111 mmol/L   CO2 26 22 - 32 mmol/L   Glucose, Bld 117 (H) 70 - 99 mg/dL   BUN 18 6 - 20 mg/dL   Creatinine, Ser 3.79 0.61 - 1.24 mg/dL   Calcium 9.1 8.9 - 02.4 mg/dL   GFR calc non Af Amer >60 >60 mL/min   GFR calc Af Amer >60 >60 mL/min   Anion gap 10 5 - 15  Procalcitonin - Baseline  Result Value Ref Range   Procalcitonin <0.10 ng/mL  Influenza panel by PCR (type A & B)  Result Value Ref Range   Influenza A By PCR NEGATIVE NEGATIVE   Influenza B By PCR NEGATIVE NEGATIVE      Assessment & Plan:   Problem List Items Addressed This Visit    History of gout   Relevant Medications   indomethacin (INDOCIN) 50 MG capsule    Other Visit Diagnoses    Rib sprain, initial encounter    -  Primary   Relevant Medications   cyclobenzaprine (FLEXERIL) 10 MG tablet      Acute Left low back flank pain over rib, from recent lifting up injury Similar to previous location injured with  coughing 2 month ago, seems consistent with rib sprain give reproducible localized and symptoms. No lung or  respiratory involvement. Not entirely consistent with back pain OA/DJD or his chronic pains.  Trial on NSAID PRN, will refill Indomethicin for gout but can try short course for his rib Order cyclobenzaprine 5-10mg  TID PRN caution sedation Use tylenol PRN Use topical heating pad Avoid reinjury Defer X-ray at this time. Follow-up if not improve  Meds ordered this encounter  Medications  . indomethacin (INDOCIN) 50 MG capsule    Sig: Take 1 capsule (50 mg total) by mouth 2 (two) times daily as needed for moderate pain (gout). For up to 5-7 days as needed for gout flare    Dispense:  30 capsule    Refill:  3  . cyclobenzaprine (FLEXERIL) 10 MG tablet    Sig: Take 0.5-1 tablets (5-10 mg total) by mouth 3 (three) times daily as needed for muscle spasms.    Dispense:  30 tablet    Refill:  1      Follow up plan: Return in about 4 weeks (around 09/24/2019) for 4 weeks follow up Lab Results / Gout.  Future labs ordered for 09/17/19 re ordered  Saralyn Pilar, DO [facility] 08/27/2019, 8:59 AM

## 2019-08-27 NOTE — Patient Instructions (Addendum)
Thank you for coming to the office today.  Check prices of Colchicine. - Mitigare or Colcrys are the two brand names that are most commonly prescribed.  Start Cyclobenzapine (Flexeril) 10mg  tablets (muscle relaxant) - cut in half for 5mg  at night for muscle relaxant - may make you sedated or sleepy (be careful driving or working on this) if tolerated you can take half to whole tab 2 to 3 times daily or every 8 hours as needed  Refilled Indomethicin  DUE for FASTING BLOOD WORK (no food or drink after midnight before the lab appointment, only water or coffee without cream/sugar on the morning of)  SCHEDULE "Lab Only" visit in the morning at the clinic for lab draw in 3-4 WEEKS   - Make sure Lab Only appointment is at about 1 week before your next appointment, so that results will be available  For Lab Results, once available within 2-3 days of blood draw, you can can log in to MyChart online to view your results and a brief explanation. Also, we can discuss results at next follow-up visit.    Please schedule a Follow-up Appointment to: Return in about 4 weeks (around 09/24/2019) for 4 weeks follow up Lab Results / Gout.  If you have any other questions or concerns, please feel free to call the office or send a message through MyChart. You may also schedule an earlier appointment if necessary.  Additionally, you may be receiving a survey about your experience at our office within a few days to 1 week by e-mail or mail. We value your feedback.  , DO [facility], Saralyn Pilar

## 2019-08-30 ENCOUNTER — Telehealth: Payer: Self-pay | Admitting: Family Medicine

## 2019-08-30 DIAGNOSIS — M109 Gout, unspecified: Secondary | ICD-10-CM

## 2019-08-30 MED ORDER — PREDNISONE 20 MG PO TABS: ORAL_TABLET | ORAL | 0 refills | Status: DC

## 2019-08-30 NOTE — Telephone Encounter (Signed)
Copied from CRM [id number]. Topic: General - Inquiry >> Aug 30, 2019 12:55 PM Daphine Deutscher D wrote: Reason for CRM: Pt called saying he was in the office Friday for pain.  He said his knee is swollen and painful.  He thinks it is gout.  He said the muscle relaxors are making him sleepy but not helping with the pain. He uses [facility] [place]  CB#  [phone]

## 2019-08-30 NOTE — Telephone Encounter (Signed)
Pt would like to know if provider would send in a Rx for Prednisone. Pt says that he is having a gout flare up

## 2019-08-30 NOTE — Telephone Encounter (Signed)
Duplicate message. Already addressed  Saralyn Pilar, DO [facility] 08/30/2019, 6:41 PM

## 2019-08-30 NOTE — Telephone Encounter (Signed)
Patient had recent office visit on Friday, 08/27/19 and was prescribed cyclobenzaprine HCl 5-10 mg.

## 2019-08-30 NOTE — Telephone Encounter (Signed)
Please notify patient that I have sent Prednisone on 08/30/19, after clinic hours  Patient was just seen in office 08/27/19.  His Indomethicin was refilled for gout flare. He did not have gout flare at time of visit.  Now he calls with gout flare. I have agreed to order Prednisone taper over 7 days. He may start that for gout flare if the Indomethicin was not working. He should NOT take both meds, only one of them. If he starts prednisone, should stop indomethicine / ibuprofen / aleve etc.  Tylenol is safe up to 1000mg  3 times a day.  Follow up if not improving.  , DO [facility] 08/30/2019, 6:40 PM

## 2019-08-31 NOTE — Telephone Encounter (Signed)
Called patient no VM set up.

## 2019-09-01 NOTE — Telephone Encounter (Signed)
Patient advised.

## 2019-09-08 IMAGING — DX PORTABLE CHEST - 1 VIEW
1 series · 1 of 1 positions shown · non-contrast
Comparison: 04/10/2016

CLINICAL DATA: Fever, cough, shortness of breath.

EXAM:
PORTABLE CHEST 1 VIEW

[chest ap]
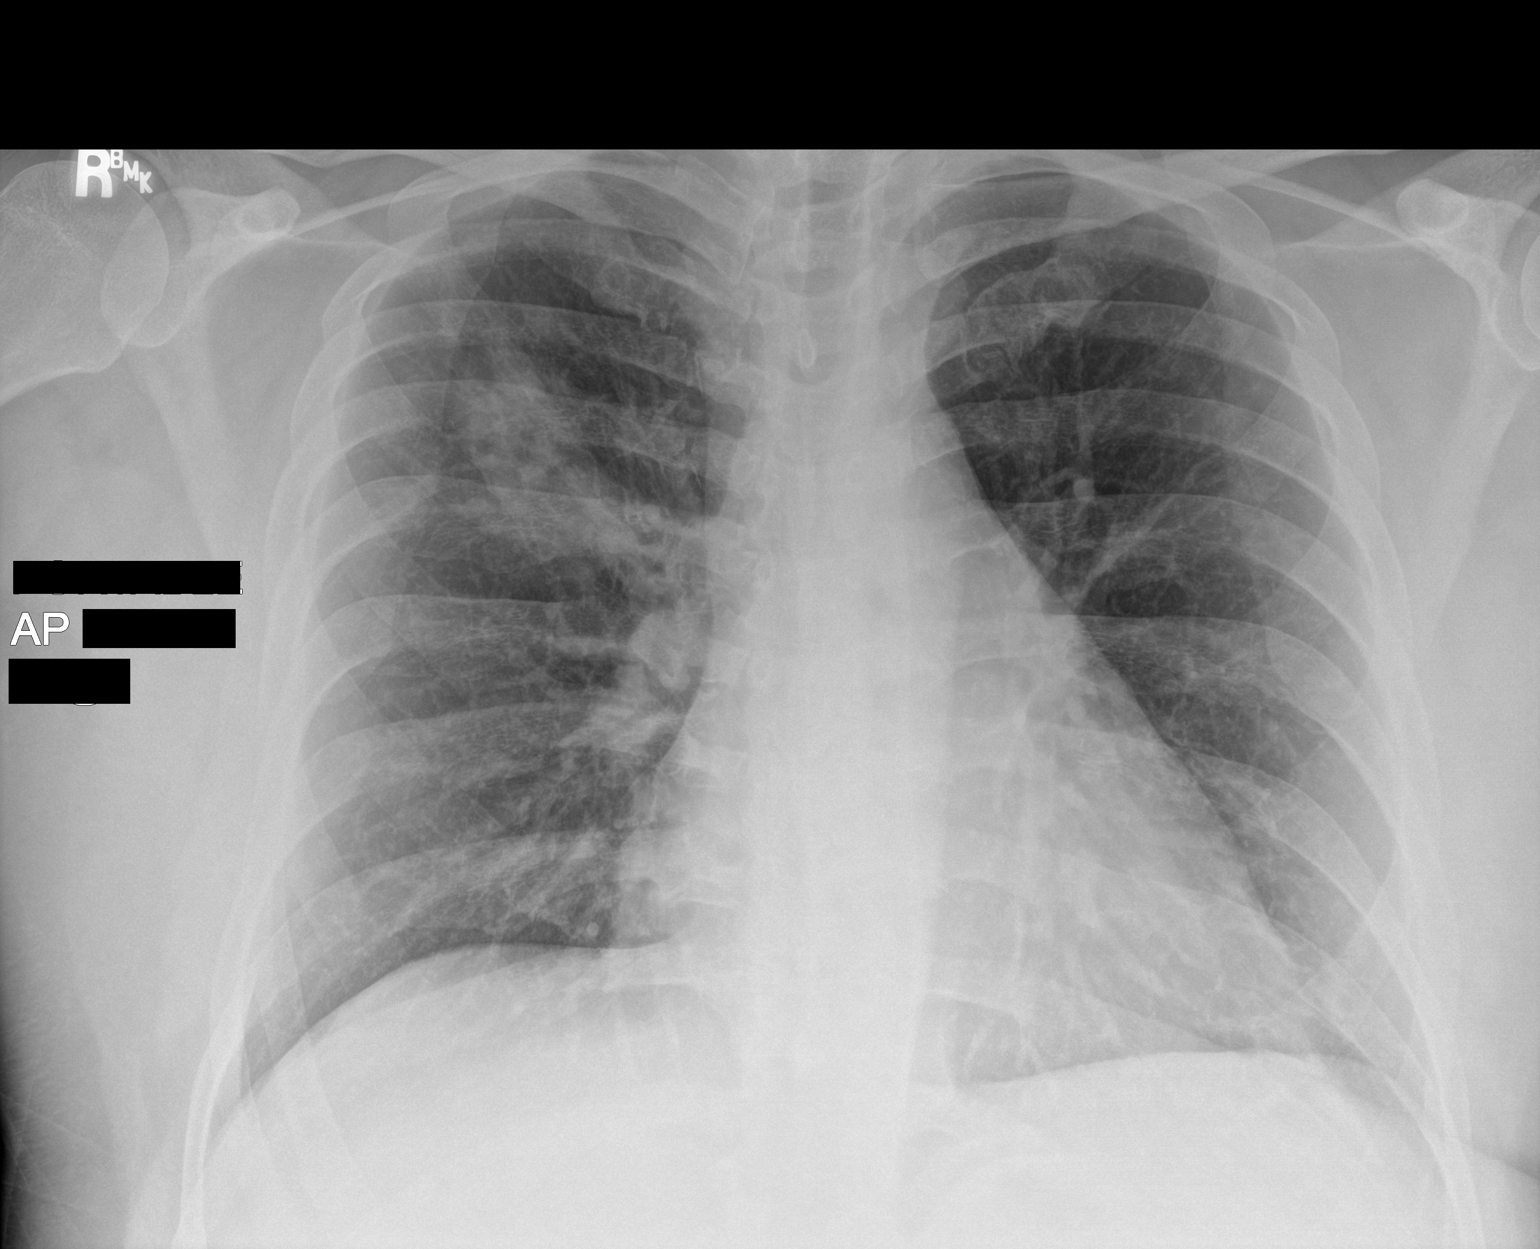

[1 of 1 positions shown; findings below may reference images not displayed]

FINDINGS: Moderate sized right suprahilar opacity. Background bronchial
thickening which is chronic. Unchanged heart size and mediastinal
contours. No pleural effusion or pneumothorax. No acute osseous
abnormality.
IMPRESSION: Moderate right suprahilar opacity consistent with pneumonia in the
setting of cough and fever. Background bronchial thickening is
chronic and unchanged from 3012.

## 2019-09-17 ENCOUNTER — Other Ambulatory Visit: Payer: BC Managed Care – PPO

## 2019-09-17 ENCOUNTER — Other Ambulatory Visit: Payer: Self-pay

## 2019-09-17 DIAGNOSIS — E669 Obesity, unspecified: Secondary | ICD-10-CM

## 2019-09-17 DIAGNOSIS — Z8739 Personal history of other diseases of the musculoskeletal system and connective tissue: Secondary | ICD-10-CM | POA: Diagnosis not present

## 2019-09-17 DIAGNOSIS — R7309 Other abnormal glucose: Secondary | ICD-10-CM

## 2019-09-17 DIAGNOSIS — Z8269 Family history of other diseases of the musculoskeletal system and connective tissue: Secondary | ICD-10-CM

## 2019-09-17 DIAGNOSIS — Z8261 Family history of arthritis: Secondary | ICD-10-CM

## 2019-09-17 DIAGNOSIS — M129 Arthropathy, unspecified: Secondary | ICD-10-CM

## 2019-09-20 LAB — CBC WITH DIFFERENTIAL/PLATELET
Absolute Monocytes: 616 cells/uL (ref 200–950)
Basophils Absolute: 23 cells/uL (ref 0–200)
Basophils Relative: 0.4 %
Eosinophils Absolute: 120 cells/uL (ref 15–500)
Eosinophils Relative: 2.1 %
HCT: 42.4 % (ref 38.5–50.0)
Hemoglobin: 14.4 g/dL (ref 13.2–17.1)
Lymphs Abs: 1368 cells/uL (ref 850–3900)
MCH: 29.5 pg (ref 27.0–33.0)
MCHC: 34 g/dL (ref 32.0–36.0)
MCV: 86.9 fL (ref 80.0–100.0)
MPV: 11.2 fL (ref 7.5–12.5)
Monocytes Relative: 10.8 %
Neutro Abs: 3574 cells/uL (ref 1500–7800)
Neutrophils Relative %: 62.7 %
Platelets: 205 10*3/uL (ref 140–400)
RBC: 4.88 10*6/uL (ref 4.20–5.80)
RDW: 12.2 % (ref 11.0–15.0)
Total Lymphocyte: 24 %
WBC: 5.7 10*3/uL (ref 3.8–10.8)

## 2019-09-20 LAB — COMPLETE METABOLIC PANEL WITH GFR
AG Ratio: 1.6 (calc) (ref 1.0–2.5)
ALT: 12 U/L (ref 9–46)
AST: 14 U/L (ref 10–40)
Albumin: 4.1 g/dL (ref 3.6–5.1)
Alkaline phosphatase (APISO): 76 U/L (ref 36–130)
BUN: 18 mg/dL (ref 7–25)
CO2: 24 mmol/L (ref 20–32)
Calcium: 9.4 mg/dL (ref 8.6–10.3)
Chloride: 107 mmol/L (ref 98–110)
Creat: 1.22 mg/dL (ref 0.60–1.35)
GFR, Est African American: 86 mL/min/{1.73_m2} (ref >=60)
GFR, Est Non African American: 74 mL/min/{1.73_m2} (ref >=60)
Globulin: 2.5 g/dL (calc) (ref 1.9–3.7)
Glucose, Bld: 102 mg/dL — ABNORMAL HIGH (ref 65–99)
Potassium: 4.2 mmol/L (ref 3.5–5.3)
Sodium: 141 mmol/L (ref 135–146)
Total Bilirubin: 0.6 mg/dL (ref 0.2–1.2)
Total Protein: 6.6 g/dL (ref 6.1–8.1)

## 2019-09-20 LAB — RHEUMATOID FACTOR: Rheumatoid fact SerPl-aCnc: <14 IU/mL (ref <14)

## 2019-09-20 LAB — HEMOGLOBIN A1C
Hgb A1c MFr Bld: 5.4 % of total Hgb (ref <5.7)
Mean Plasma Glucose: 108 (calc)
eAG (mmol/L): 6 (calc)

## 2019-09-20 LAB — ANA: Anti Nuclear Antibody (ANA): NEGATIVE

## 2019-09-20 LAB — SEDIMENTATION RATE: Sed Rate: 17 mm/h — ABNORMAL HIGH (ref 0–15)

## 2019-09-20 LAB — CYCLIC CITRUL PEPTIDE ANTIBODY, IGG: Cyclic Citrullin Peptide Ab: <16 UNITS

## 2019-09-20 LAB — URIC ACID: Uric Acid, Serum: 10.1 mg/dL — ABNORMAL HIGH (ref 4.0–8.0)

## 2019-09-20 LAB — C-REACTIVE PROTEIN: CRP: 16.4 mg/L — ABNORMAL HIGH (ref <8.0)

## 2019-09-24 ENCOUNTER — Other Ambulatory Visit: Payer: Self-pay

## 2019-09-24 ENCOUNTER — Encounter: Payer: Self-pay | Admitting: Family Medicine

## 2019-09-24 ENCOUNTER — Ambulatory Visit (INDEPENDENT_AMBULATORY_CARE_PROVIDER_SITE_OTHER): Payer: BC Managed Care – PPO | Admitting: Family Medicine

## 2019-09-24 ENCOUNTER — Other Ambulatory Visit: Payer: Self-pay | Admitting: Family Medicine

## 2019-09-24 VITALS — BP 125/79 | HR 64 | Temp 97.3°F | Resp 16 | Ht 72.0 in | Wt 275.0 lb

## 2019-09-24 DIAGNOSIS — M129 Arthropathy, unspecified: Secondary | ICD-10-CM

## 2019-09-24 DIAGNOSIS — M7021 Olecranon bursitis, right elbow: Secondary | ICD-10-CM | POA: Diagnosis not present

## 2019-09-24 DIAGNOSIS — M1A0710 Idiopathic chronic gout, right ankle and foot, without tophus (tophi): Secondary | ICD-10-CM

## 2019-09-24 DIAGNOSIS — M1A9XX0 Chronic gout, unspecified, without tophus (tophi): Secondary | ICD-10-CM | POA: Insufficient documentation

## 2019-09-24 DIAGNOSIS — R5383 Other fatigue: Secondary | ICD-10-CM | POA: Diagnosis not present

## 2019-09-24 DIAGNOSIS — E785 Hyperlipidemia, unspecified: Secondary | ICD-10-CM

## 2019-09-24 DIAGNOSIS — E559 Vitamin D deficiency, unspecified: Secondary | ICD-10-CM

## 2019-09-24 DIAGNOSIS — E538 Deficiency of other specified B group vitamins: Secondary | ICD-10-CM

## 2019-09-24 DIAGNOSIS — E669 Obesity, unspecified: Secondary | ICD-10-CM

## 2019-09-24 MED ORDER — ALLOPURINOL 300 MG PO TABS: 300.0000 mg | ORAL_TABLET | Freq: Every day | ORAL | 1 refills | Status: DC

## 2019-09-24 MED ORDER — COLCHICINE 0.6 MG PO TABS: 0.6000 mg | ORAL_TABLET | Freq: Every day | ORAL | 5 refills | Status: DC

## 2019-09-24 NOTE — Assessment & Plan Note (Signed)
Clinically consistent with subacute on chronic gout flare of Right foot/ankle No acute inflammation obvious on exam, but has some chronic pain. No obvious tophi complication Known history of gout for years - No longer on uric acid lowering therapy - Last uric acid level 10.5 (09/2019) - significantly elevated  Plan: 1. Start back on Allopurinol 300mg  once daily - longer term prevention, has failed 100mg  in past will start with 300 has tolerated before. 2. Concurrent anti inflammatory to avoid triggering acute flare - Start colchicine 0.6mg  daily then increase to BID if needed after 1-2 weeks if breakthrough symptoms - will send generic, may switch to brand based on cost Mitigare or Colcrys - DURATION 3 to 6 months while initiating allopurinol. Then can STOP and use PRN only flares. Reviewed benefits risks side effect 3. Avoid excessive ambulation, relative rest, ice if helps, can take Tylenol PRN 4. Avoid food triggers (red meat, alcohol) Follow-up 3 months repeat Uric Acid  Future discussions on gout, and may be primary inflammatory condition affecting him, less likely other rheumatological conditions but cannot rule out others. Offered consultation with Rheumatologist if interested for further gout and other joint pain management given his fam history. Will defer for now reconsider in 3-6 months if plan not successful

## 2019-09-24 NOTE — Patient Instructions (Addendum)
Thank you for coming to the office today.  Uric Acid 10.5, significantly elevated. Likely gout is the cause of your generalized pain and inflammation.  Start back on Allopurinol 300mg  once daily. For prevention.   Will need Colchicine every day for 3 to 6 months to avoid significant flares.  Can start colchicine 0.6 once daily, if after 1-2 weeks flare up or not effective, can add extra pill for twice a day. We can order more per month if needed.  May switch from generic to brand name (Colcrys or Mitigare) Use [employer]rx[employer]com for generic if needed for cheaper cost.  We can refer to Rheumatologist as next step if not improving. There are other gout medicines and they can also test the joint fluid.  Other labs are great. No sign of pre diabetes or diabetes.  Colon Cancer Screening: - For all adults age 56+ routine colon cancer screening is highly recommended.     - Recent guidelines from American [employer] Society recommend starting age of 2 - Early detection of colon cancer is important, because often there are no warning signs or symptoms, also if found early usually it can be cured. Late stage is hard to treat. - Special circumstances in patients with early family history of colon cancer, we recommend colonoscopy at a younger age (usually 10 years before the family member was diagnosed).  - Colonoscopy is the best test for colon cancer because it involves direct visualization and immediate treatment (compared to other imaging studies or stool cards to test for blood, that will require you to eventually get a colonoscopy if they are abnormal). - Also to consider, Cologuard is an excellent alternative for screening test for Colon Cancer. It is highly sensitive for detecting DNA of colon cancer from even the earliest stages. Also, there is NO bowel prep required. -------------------------  Go ahead and call insurance to check with a benefits rep to see if you would be covered for a Colonoscopy  (screening) given Father's age 41 with colon cancer diagnosis. See if they will cover it at your age. OR if can wait until 40.  Try OTC Vitamin Supplements - Multivitamin daily, can do Vitamin D3 1,000 to 2,000 once a day, and or Vitamin B12 1,000 daily for a period of time to see what works.  Please schedule a Follow-up Appointment to: Return in about 3 months (around 12/25/2019) for 3 month Lab results for Gout.  If you have any other questions or concerns, please feel free to call the office or send a message through MyChart. You may also schedule an earlier appointment if necessary.  Additionally, you may be receiving a survey about your experience at our office within a few days to 1 week by e-mail or mail. We value your feedback.  12/27/2019, DO [facility], [facility]

## 2019-09-24 NOTE — Progress Notes (Signed)
Subjective:    Patient ID: Benjamin Mccarty, male    DOB: Feb 07, 1981, 39 y.o.   MRN: [id number]  Benjamin Mccarty is a 39 y.o. male presenting on 09/24/2019 for Gout   HPI  Gout, chronic multiple joints Reviewed history of chronic gout, for years with recurrent flares in various joints, usually R foot or ankle, not always R great toe. In past he had been on various doses of Allopurinol 100 to '300mg'$ . 100 was ineffective, and he was on 300 but off now for several months, he did not take during acute gout flares and has remained off of it since unsure if he can resume it. No other gout meds for prevention, he was on colchicine and indomethicin in past for flares, also trial on prednisone. - He has had some mild episodic joint pains for period of time, no recent acute severe flare. - Last lab showed Uric Acid 10.5 significantly elevated. This is OFF Allopurinol for several months. - He has not seen Rheumatologist - Additional labs done showed positive inflammatory markers ESR 17 and CRP 16.4, otherwise negative rheumatological screening RF, Anti CCP, ANA tests. He has fam history of rheumatological conditions with Lupus. - Today he is having some mild joint pain in R foot and asking about managing gout with medicines again. Denies any fever chills redness of joint other joint pain or injury  Colon CA Screening: Never had colonoscopy. He is age 43 currently, will be 65 in 05/2020. He has strong family history of colon cancer with Father dx Colon Cancer at age 25. He was told to be screened between ages 44 to 56. Siblings already screened. He is asymptomatic. He is asking about possibility of colonoscopy. Also asking about Cologuard.  Additional concerns  R Elbow - Olecranon bursitis or swelling. Non tender but bothersome.  Reduced Energy - he admits persistent problem with some reduced energy overall worse at times if active, he says working longer hours and on feet and strains himself. He asks  about anything he can do or take to improve energy. Does not think he has depression. But admits yes some mental or emotional fatigue. Possibly poor sleep with waking up due to gout or pain at night occasionally.   Depression screen [facility] 2/9 09/24/2019 02/26/2019  Decreased Interest 0 0  Down, Depressed, Hopeless 0 0  PHQ - 2 Score 0 0   No flowsheet data found.   Social History   Tobacco Use  . Smoking status: Never Smoker  . Smokeless tobacco: Never Used  Substance Use Topics  . Alcohol use: No  . Drug use: Never    Review of Systems Per HPI unless specifically indicated above     Objective:    BP 125/79   Pulse 64   Temp (!) 97.3 F (36.3 C) (Temporal)   Resp 16   Ht 6' (1.829 m)   Wt 275 lb (124.7 kg)   BMI 37.30 kg/m   Wt Readings from Last 3 Encounters:  09/24/19 275 lb (124.7 kg)  08/27/19 276 lb (125.2 kg)  02/26/19 269 lb (122 kg)    Physical Exam Vitals and nursing note reviewed.  Constitutional:      General: He is not in acute distress.    Appearance: He is well-developed. He is not diaphoretic.     Comments: Well-appearing, comfortable, cooperative  HENT:     Head: Normocephalic and atraumatic.  Eyes:     General:  Right eye: No discharge.        Left eye: No discharge.     Conjunctiva/sclera: Conjunctivae normal.  Cardiovascular:     Rate and Rhythm: Normal rate.  Pulmonary:     Effort: Pulmonary effort is normal.  Musculoskeletal:     Comments: Right elbow large soft fluctuance non tender olecranon bursal fluid  Right Foot and ankle, with some mild non pitting edema, no erythema, mild discomfort  Knees with crepitus and some slight restricted ROM but non tender today  Skin:    General: Skin is warm and dry.     Findings: No erythema or rash.     Comments: Left face with port wine stain across eye area, nasal, and cheek  Neurological:     Mental Status: He is alert and oriented to person, place, and time.  Psychiatric:         Behavior: Behavior normal.     Comments: Well groomed, good eye contact, normal speech and thoughts      Results for orders placed or performed in visit on 09/17/19  Sed Rate (ESR)  Result Value Ref Range   Sed Rate 17 (H) 0 - 15 mm/h  C-reactive protein  Result Value Ref Range   CRP 16.4 (H) <9.1 mg/L  Cyclic citrul peptide antibody, IgG  Result Value Ref Range   Cyclic Citrullin Peptide Ab <16 UNITS  Rheumatoid Factor  Result Value Ref Range   Rhuematoid fact SerPl-aCnc <14 <14 IU/mL  ANA  Result Value Ref Range   Anti Nuclear Antibody (ANA) NEGATIVE NEGATIVE  Uric acid  Result Value Ref Range   Uric Acid, Serum 10.1 (H) 4.0 - 8.0 mg/dL  COMPLETE METABOLIC PANEL WITH GFR  Result Value Ref Range   Glucose, Bld 102 (H) 65 - 99 mg/dL   BUN 18 7 - 25 mg/dL   Creat 1.22 0.60 - 1.35 mg/dL   GFR, Est Non African American 74 > OR = 60 mL/min/1.72m   GFR, Est African American 86 > OR = 60 mL/min/1.764m  BUN/Creatinine Ratio NOT APPLICABLE 6 - 22 (calc)   Sodium 141 135 - 146 mmol/L   Potassium 4.2 3.5 - 5.3 mmol/L   Chloride 107 98 - 110 mmol/L   CO2 24 20 - 32 mmol/L   Calcium 9.4 8.6 - 10.3 mg/dL   Total Protein 6.6 6.1 - 8.1 g/dL   Albumin 4.1 3.6 - 5.1 g/dL   Globulin 2.5 1.9 - 3.7 g/dL (calc)   AG Ratio 1.6 1.0 - 2.5 (calc)   Total Bilirubin 0.6 0.2 - 1.2 mg/dL   Alkaline phosphatase (APISO) 76 36 - 130 U/L   AST 14 10 - 40 U/L   ALT 12 9 - 46 U/L  CBC with Differential/Platelet  Result Value Ref Range   WBC 5.7 3.8 - 10.8 Thousand/uL   RBC 4.88 4.20 - 5.80 Million/uL   Hemoglobin 14.4 13.2 - 17.1 g/dL   HCT 42.4 38.5 - 50.0 %   MCV 86.9 80.0 - 100.0 fL   MCH 29.5 27.0 - 33.0 pg   MCHC 34.0 32.0 - 36.0 g/dL   RDW 12.2 11.0 - 15.0 %   Platelets 205 140 - 400 Thousand/uL   MPV 11.2 7.5 - 12.5 fL   Neutro Abs 3,574 1,500 - 7,800 cells/uL   Lymphs Abs 1,368 850 - 3,900 cells/uL   Absolute Monocytes 616 200 - 950 cells/uL   Eosinophils Absolute 120 15 - 500  cells/uL   Basophils  Absolute 23 0 - 200 cells/uL   Neutrophils Relative % 62.7 %   Total Lymphocyte 24.0 %   Monocytes Relative 10.8 %   Eosinophils Relative 2.1 %   Basophils Relative 0.4 %  Hemoglobin A1c  Result Value Ref Range   Hgb A1c MFr Bld 5.4 <5.7 % of total Hgb   Mean Plasma Glucose 108 (calc)   eAG (mmol/L) 6.0 (calc)      Assessment & Plan:   Problem List Items Addressed This Visit    Idiopathic chronic gout of right 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 No obvious tophi complication Known history of gout for years - No longer on uric acid lowering therapy - Last uric acid level 10.5 (09/2019) - significantly elevated  Plan: 1. Start back on Allopurinol '300mg'$  once daily - longer term prevention, has failed '100mg'$  in past will start with 300 has tolerated before. 2. Concurrent anti inflammatory to avoid triggering acute flare - Start colchicine 0.'6mg'$  daily then increase to BID if needed after 1-2 weeks if breakthrough symptoms - will send generic, may switch to brand based on cost Mitigare or Colcrys - DURATION 3 to 6 months while initiating allopurinol. Then can STOP and use PRN only flares. Reviewed benefits risks side effect 3. Avoid excessive ambulation, relative rest, ice if helps, can take Tylenol PRN 4. Avoid food triggers (red meat, alcohol) Follow-up 3 months repeat Uric Acid  Future discussions on gout, and may be primary inflammatory condition affecting him, less likely other rheumatological conditions but cannot rule out others. Offered consultation with Rheumatologist if interested for further gout and other joint pain management given his fam history. Will defer for now reconsider in 3-6 months if plan not successful      Relevant Medications   allopurinol (ZYLOPRIM) 300 MG tablet   colchicine 0.6 MG tablet    Arthritis, multiple joint involvement    Other Visit Diagnoses    Olecranon bursitis, right elbow       Decreased energy         #Reduced Energy Question if sleep or physical/joint pain or emotional component Will check various labs at next visit as well to investigate based on history of some vitamin Deficiency possibly nutritional deficiency  #Olecranon bursitis Not acutely inflamed. Not focus for day, can do compression if helpful and follow up if flare - may improve on current medicine course.   #Colon CA Screening Patient would certainly be due or eligible for colon cancer screening starting at age 86+ given that would be 58 years earlier than father dx at age 99 with colon CA. However, unsure if insurance coverage at age 89, he will call to find out, request order colonoscopy if eligible. Discussed cologuard as well, however likely not covered until age 31+.   Meds ordered this encounter  Medications  . allopurinol (ZYLOPRIM) 300 MG tablet    Sig: Take 1 tablet (300 mg total) by mouth daily.    Dispense:  90 tablet    Refill:  1  . colchicine 0.6 MG tablet    Sig: Take 1 tablet (0.6 mg total) by mouth daily. For prevention of gout flare for 3 to 6 months    Dispense:  30 tablet    Refill:  5    If cost is too high or not covered, and a brand name is preferred -  please notify our office and we can change it.     Follow up plan: Return in about 3 months (around 12/25/2019) for 3 month Lab results for Gout.  Future labs ordered for 12/12/19 approx labs   Nobie Putnam, [facility] 09/24/2019, 8:45 AM

## 2020-01-03 ENCOUNTER — Other Ambulatory Visit: Payer: Self-pay

## 2020-01-03 ENCOUNTER — Ambulatory Visit (INDEPENDENT_AMBULATORY_CARE_PROVIDER_SITE_OTHER): Payer: BC Managed Care – PPO | Admitting: Family Medicine

## 2020-01-03 ENCOUNTER — Encounter: Payer: Self-pay | Admitting: Family Medicine

## 2020-01-03 VITALS — Temp 98.6°F

## 2020-01-03 DIAGNOSIS — J019 Acute sinusitis, unspecified: Secondary | ICD-10-CM

## 2020-01-03 MED ORDER — AMOXICILLIN-POT CLAVULANATE 875-125 MG PO TABS: 1.0000 | ORAL_TABLET | Freq: Two times a day (BID) | ORAL | 0 refills | Status: DC

## 2020-01-03 NOTE — Progress Notes (Signed)
Virtual Visit via Telephone  The purpose of this virtual visit is to provide medical care while limiting exposure to the novel coronavirus (COVID19) for both patient and office staff.  Consent was obtained for phone visit:  Yes.   Answered questions that patient had about telehealth interaction:  Yes.   I discussed the limitations, risks, security and privacy concerns of performing an evaluation and management service by telephone. I also discussed with the patient that there may be a patient responsible charge related to this service. The patient expressed understanding and agreed to proceed.  Patient is at home and is accessed via telephone Services are provided by Charlaine Dalton, FNP-C from [facility])  ---------------------------------------------------------------------- Chief Complaint  Patient presents with  . Sore Throat    nasal drainage, post nasal drainage, productive coughing, mild headache and bodyaches x 3 days     S: Reviewed CMA documentation. I have called patient and gathered additional HPI as follows:  Benjamin Mccarty presents to clinic for telemedicine visit.  Reports that he has been having green and yellow nasal drainage, post nasal drip, productive cough first thing in the morning, cough worse when laying down, sore throat, sinus pressure/pain, mild headache and body aches x 3 days.  Reports body aches he believes are from laying down for the past 3 days due to not feeling well.  Reports had COVID back in January.  Denies known sick contacts or exposure to anyone with COVID.  Denies fevers, chills, shakes, SOB, CP, abdominal pain, n/v/d.  Reports history of sinus infections in the past.  Has been taking zyrtec, flonase, mucinex and robitussin DM to help with his symptoms with mild relief  Patient is currently home Denies any high risk travel to areas of current concern for COVID19. Denies any known or suspected exposure to person with or possibly  with COVID19.  Past Medical History:  Diagnosis Date  . Gout    Social History   Tobacco Use  . Smoking status: Never Smoker  . Smokeless tobacco: Never Used  Substance Use Topics  . Alcohol use: No  . Drug use: Never    Current Outpatient Medications:  .  allopurinol (ZYLOPRIM) 300 MG tablet, Take 1 tablet (300 mg total) by mouth daily., Disp: 90 tablet, Rfl: 1 .  colchicine 0.6 MG tablet, Take 1 tablet (0.6 mg total) by mouth daily. For prevention of gout flare for 3 to 6 months, Disp: 30 tablet, Rfl: 5 .  indomethacin (INDOCIN) 50 MG capsule, Take 1 capsule (50 mg total) by mouth 2 (two) times daily as needed for moderate pain (gout). For up to 5-7 days as needed for gout flare, Disp: 30 capsule, Rfl: 3 .  Phenylephrine-APAP-guaiFENesin (MUCINEX SINUS-MAX) 10-650-400 MG/20ML LIQD, Take by mouth., Disp: , Rfl:  .  amoxicillin-clavulanate (AUGMENTIN) 875-125 MG tablet, Take 1 tablet by mouth 2 (two) times daily., Disp: 20 tablet, Rfl: 0 .  cyclobenzaprine (FLEXERIL) 10 MG tablet, Take 0.5-1 tablets (5-10 mg total) by mouth 3 (three) times daily as needed for muscle spasms. (Patient not taking: Reported on 01/03/2020), Disp: 30 tablet, Rfl: 1  Depression screen [facility] 2/9 09/24/2019 02/26/2019  Decreased Interest 0 0  Down, Depressed, Hopeless 0 0  PHQ - 2 Score 0 0    No flowsheet data found.  -------------------------------------------------------------------------- O: No physical exam performed due to remote telephone encounter.  Physical Exam: Patient remotely monitored without video.  Verbal communication appropriate.  Cognition normal.  No results found for  this or any previous visit (from the past 2160 hour(s)).  -------------------------------------------------------------------------- A&P:  Problem List Items Addressed This Visit      Respiratory   Acute non-recurrent sinusitis - Primary    Likely acute sinusitis based on symptoms reported.  Is currently taking  zyrtec, flonase, robitussin DM and mucinex to help with symptoms with mild relief.  Blowing green/yellow out of nose.  Will treat with Augmentin 875/125mg  BID x 10 days.  Continue all OTC treatments.  Plan: 1. Begin Augmentin 875/125mg  BID x 10 days 2. Continue zyrtec, flonase, robitussin DM, and mucinex 3. RTC if symptoms worsen or fail to improve with current treatment.      Relevant Medications   Phenylephrine-APAP-guaiFENesin (MUCINEX SINUS-MAX) 10-650-400 MG/20ML LIQD   amoxicillin-clavulanate (AUGMENTIN) 875-125 MG tablet      Meds ordered this encounter  Medications  . amoxicillin-clavulanate (AUGMENTIN) 875-125 MG tablet    Sig: Take 1 tablet by mouth 2 (two) times daily.    Dispense:  20 tablet    Refill:  0    Follow-up: - Return if symptoms worsen or fail to improve  Patient verbalizes understanding with the above medical recommendations including the limitation of remote medical advice.  Specific follow-up and call-back criteria were given for patient to follow-up or seek medical care more urgently if needed.  - Time spent in direct consultation with patient on phone: 7 minutes  Charlaine Dalton, FNP-C [facility] 01/03/2020, 11:07 AM

## 2020-01-03 NOTE — Assessment & Plan Note (Signed)
Likely acute sinusitis based on symptoms reported.  Is currently taking zyrtec, flonase, robitussin DM and mucinex to help with symptoms with mild relief.  Blowing green/yellow out of nose.  Will treat with Augmentin 875/125mg  BID x 10 days.  Continue all OTC treatments.  Plan: 1. Begin Augmentin 875/125mg  BID x 10 days 2. Continue zyrtec, flonase, robitussin DM, and mucinex 3. RTC if symptoms worsen or fail to improve with current treatment.

## 2020-01-03 NOTE — Patient Instructions (Signed)
As we discussed, we are treating you for sinusitis based on your reported symptoms.  I have sent in a prescription for Augmentin to take 1 tablet 2x per day for the next 10 days.  This medication can be hard on your stomach, so be sure to take with food.  Can continue to take your zyrtec, flonase, mucinex and robitussin DM to help with your symptom management.  We will plan to see you back if your symptoms worsen or fail to improve  You will receive a survey after today's visit either digitally by e-mail or paper by USPS mail. Your experiences and feedback matter to [place].  Please respond so we know how we are doing as we provide care for you.  Call us with any questions/concerns/needs.  It is my goal to be available to you for your health concerns.  Thanks for choosing me to be a partner in your healthcare needs!  Charlaine Dalton, FNP-C Family Nurse Practitioner [facility] Phone: [phone]

## 2020-06-18 ENCOUNTER — Other Ambulatory Visit: Payer: Self-pay | Admitting: Family Medicine

## 2020-06-18 DIAGNOSIS — M1A0710 Idiopathic chronic gout, right ankle and foot, without tophus (tophi): Secondary | ICD-10-CM

## 2022-05-16 ENCOUNTER — Ambulatory Visit: Payer: BC Managed Care – PPO | Admitting: Family Medicine

## 2022-05-16 ENCOUNTER — Encounter: Payer: Self-pay | Admitting: Family Medicine

## 2022-05-16 VITALS — BP 126/88 | HR 66 | Ht 72.0 in | Wt 316.0 lb

## 2022-05-16 DIAGNOSIS — M10262 Drug-induced gout, left knee: Secondary | ICD-10-CM

## 2022-05-16 DIAGNOSIS — M1A0710 Idiopathic chronic gout, right ankle and foot, without tophus (tophi): Secondary | ICD-10-CM | POA: Diagnosis not present

## 2022-05-16 MED ORDER — PREDNISONE 20 MG PO TABS: ORAL_TABLET | ORAL | 0 refills | Status: DC

## 2022-05-16 MED ORDER — ALLOPURINOL 300 MG PO TABS: 300.0000 mg | ORAL_TABLET | Freq: Every day | ORAL | 1 refills | Status: DC

## 2022-05-16 MED ORDER — COLCHICINE 0.6 MG PO TABS: 0.6000 mg | ORAL_TABLET | Freq: Every day | ORAL | 0 refills | Status: DC

## 2022-05-16 NOTE — Patient Instructions (Addendum)
Thank you for coming to the office today.  Acute gout flare treatment today  Start Prednisone 7 day taper Hold anti inflammatories  After 7 day gout flare, if resolved  Start the Colchicine 0.6mg  daily for prevention for 30 days + Allopurinol half tab of the 300 = 150mg  daily for 30 days, then increase Allopurinol to 300mg  daily, and before you run out, notify me about Colchicine refill for acute flares only.   DUE for FASTING BLOOD WORK (no food or drink after midnight before the lab appointment, only water or coffee without cream/sugar on the morning of)  SCHEDULE "Lab Only" visit in the morning at the clinic for lab draw in 8 WEEKS   - Make sure Lab Only appointment is at about 1 week before your next appointment, so that results will be available  For Lab Results, once available within 2-3 days of blood draw, you can can log in to MyChart online to view your results and a brief explanation. Also, we can discuss results at next follow-up visit.   Please schedule a Follow-up Appointment to: Return in about 8 weeks (around 07/11/2022) for 8 weeks fasting lab only then 1 week later Annual Physical.  If you have any other questions or concerns, please feel free to call the office or send a message through [place]. You may also schedule an earlier appointment if necessary.  Additionally, you may be receiving a survey about your experience at our office within a few days to 1 week by e-mail or mail. We value your feedback.  Nobie Putnam, DO [place]

## 2022-05-16 NOTE — Progress Notes (Signed)
Subjective:    Patient ID: Benjamin Mccarty, male    DOB: 1981/02/16, 42 y.o.   MRN: [id number]  Benjamin Mccarty is a 42 y.o. male presenting on 05/16/2022 for Gout and Knee Pain   HPI  Gout, chronic multiple joints Reviewed history of chronic gout, for years with recurrent flares in various joints, usually R foot or ankle, not always R great toe. In past he had been on various doses of Allopurinol 100 to 372m. 100 was ineffective, and he was on 300 but off now for several months, he did not take during acute gout flares and has remained off of it since unsure if he can resume it. No other gout meds for prevention, he was on colchicine and indomethicin in past for flares, also trial on prednisone. - He has had some mild episodic joint pains for period of time, no recent acute severe flare. - Last lab showed Uric Acid 10.5 significantly elevated. This is OFF Allopurinol for several months. - He has not seen Rheumatologist - Additional labs done showed positive inflammatory markers ESR 17 and CRP 16.4, otherwise negative rheumatological screening RF, Anti CCP, ANA tests. He has fam history of rheumatological conditions with Lupus.  - Today he has had recent acute flare Gout in Left knee with recent pain some improvement but persistent swelling and tried a colchicine some improvement now out of meds Off Allopurinol He can have knee and elbows and other joints flaring   Health Maintenance: Fam history of Colon Cancer with Father, age 42      09/24/2019    8:30 AM 02/26/2019   10:09 AM  Depression screen PHQ 2/9  Decreased Interest 0 0  Down, Depressed, Hopeless 0 0  PHQ - 2 Score 0 0    Social History   Tobacco Use   Smoking status: Never   Smokeless tobacco: Never  Substance Use Topics   Alcohol use: No   Drug use: Never    Review of Systems Per HPI unless specifically indicated above     Objective:    BP 126/88   Pulse 66   Ht 6' (1.829 m)   Wt (!) 316 lb (143.3  kg)   SpO2 99%   BMI 42.86 kg/m   Wt Readings from Last 3 Encounters:  05/16/22 (!) 316 lb (143.3 kg)  09/24/19 275 lb (124.7 kg)  08/27/19 276 lb (125.2 kg)    Physical Exam Vitals and nursing note reviewed.  Constitutional:      General: He is not in acute distress.    Appearance: Normal appearance. He is well-developed. He is not diaphoretic.     Comments: Well-appearing, comfortable, cooperative  HENT:     Head: Normocephalic and atraumatic.  Eyes:     General:        Right eye: No discharge.        Left eye: No discharge.     Conjunctiva/sclera: Conjunctivae normal.  Cardiovascular:     Rate and Rhythm: Normal rate.  Pulmonary:     Effort: Pulmonary effort is normal.  Musculoskeletal:     Comments: Left Knee pain and swelling  Skin:    General: Skin is warm and dry.     Findings: No erythema or rash.  Neurological:     Mental Status: He is alert and oriented to person, place, and time.  Psychiatric:        Mood and Affect: Mood normal.        Behavior: Behavior  normal.        Thought Content: Thought content normal.     Comments: Well groomed, good eye contact, normal speech and thoughts      Results for orders placed or performed in visit on 09/17/19  Sed Rate (ESR)  Result Value Ref Range   Sed Rate 17 (H) 0 - 15 mm/h  C-reactive protein  Result Value Ref Range   CRP 16.4 (H) <3.5 mg/L  Cyclic citrul peptide antibody, IgG  Result Value Ref Range   Cyclic Citrullin Peptide Ab <16 UNITS  Rheumatoid Factor  Result Value Ref Range   Rhuematoid fact SerPl-aCnc <14 <14 IU/mL  ANA  Result Value Ref Range   Anti Nuclear Antibody (ANA) NEGATIVE NEGATIVE  Uric acid  Result Value Ref Range   Uric Acid, Serum 10.1 (H) 4.0 - 8.0 mg/dL  COMPLETE METABOLIC PANEL WITH GFR  Result Value Ref Range   Glucose, Bld 102 (H) 65 - 99 mg/dL   BUN 18 7 - 25 mg/dL   Creat 1.22 0.60 - 1.35 mg/dL   GFR, Est Non African American 74 > OR = 60 mL/min/1.60m   GFR, Est  African American 86 > OR = 60 mL/min/1.734m  BUN/Creatinine Ratio NOT APPLICABLE 6 - 22 (calc)   Sodium 141 135 - 146 mmol/L   Potassium 4.2 3.5 - 5.3 mmol/L   Chloride 107 98 - 110 mmol/L   CO2 24 20 - 32 mmol/L   Calcium 9.4 8.6 - 10.3 mg/dL   Total Protein 6.6 6.1 - 8.1 g/dL   Albumin 4.1 3.6 - 5.1 g/dL   Globulin 2.5 1.9 - 3.7 g/dL (calc)   AG Ratio 1.6 1.0 - 2.5 (calc)   Total Bilirubin 0.6 0.2 - 1.2 mg/dL   Alkaline phosphatase (APISO) 76 36 - 130 U/L   AST 14 10 - 40 U/L   ALT 12 9 - 46 U/L  CBC with Differential/Platelet  Result Value Ref Range   WBC 5.7 3.8 - 10.8 Thousand/uL   RBC 4.88 4.20 - 5.80 Million/uL   Hemoglobin 14.4 13.2 - 17.1 g/dL   HCT 42.4 38.5 - 50.0 %   MCV 86.9 80.0 - 100.0 fL   MCH 29.5 27.0 - 33.0 pg   MCHC 34.0 32.0 - 36.0 g/dL   RDW 12.2 11.0 - 15.0 %   Platelets 205 140 - 400 Thousand/uL   MPV 11.2 7.5 - 12.5 fL   Neutro Abs 3,574 1,500 - 7,800 cells/uL   Lymphs Abs 1,368 850 - 3,900 cells/uL   Absolute Monocytes 616 200 - 950 cells/uL   Eosinophils Absolute 120 15 - 500 cells/uL   Basophils Absolute 23 0 - 200 cells/uL   Neutrophils Relative % 62.7 %   Total Lymphocyte 24.0 %   Monocytes Relative 10.8 %   Eosinophils Relative 2.1 %   Basophils Relative 0.4 %  Hemoglobin A1c  Result Value Ref Range   Hgb A1c MFr Bld 5.4 <5.7 % of total Hgb   Mean Plasma Glucose 108 (calc)   eAG (mmol/L) 6.0 (calc)      Assessment & Plan:   Problem List Items Addressed This Visit     Idiopathic chronic gout of right foot without tophus   Relevant Medications   naproxen sodium (ALEVE) 220 MG tablet   colchicine 0.6 MG tablet   allopurinol (ZYLOPRIM) 300 MG tablet   predniSONE (DELTASONE) 20 MG tablet   Other Visit Diagnoses     Acute drug-induced gout of left  knee    -  Primary   Relevant Medications   naproxen sodium (ALEVE) 220 MG tablet   colchicine 0.6 MG tablet   allopurinol (ZYLOPRIM) 300 MG tablet   predniSONE (DELTASONE) 20 MG tablet        Acute gout flare treatment today  Start Prednisone 7 day taper Hold anti inflammatories  After 7 day gout flare, if resolved  Start the Colchicine 0.35m daily for prevention for 30 days + Allopurinol half tab of the 300 = 1539mdaily for 30 days, then increase Allopurinol to 30020maily, and before you run out, notify me about Colchicine refill for acute flares only.   Meds ordered this encounter  Medications   colchicine 0.6 MG tablet    Sig: Take 1 tablet (0.6 mg total) by mouth daily. For 30 days for prevention, will re order for as needed flare in future.    Dispense:  30 tablet    Refill:  0    If cost is too high or not covered, and a brand name is preferred - please notify our office and we can change it.   allopurinol (ZYLOPRIM) 300 MG tablet    Sig: Take 1 tablet (300 mg total) by mouth daily.    Dispense:  90 tablet    Refill:  1   predniSONE (DELTASONE) 20 MG tablet    Sig: Take daily with food. Start with 41m57m pills) x 2 days, then reduce to 40mg108mpills) x 2 days, then 20mg 38mill) x 3 days    Dispense:  13 tablet    Refill:  0     Follow up plan: Return in about 8 weeks (around 07/11/2022) for 8 weeks fasting lab only then 1 week later Annual Physical.  Future labs ordered for 07/11/22   Benjamin [place] 05/16/2022, 9:22 AM

## 2022-05-17 ENCOUNTER — Other Ambulatory Visit: Payer: Self-pay | Admitting: Family Medicine

## 2022-05-17 DIAGNOSIS — E669 Obesity, unspecified: Secondary | ICD-10-CM

## 2022-05-17 DIAGNOSIS — M1A0710 Idiopathic chronic gout, right ankle and foot, without tophus (tophi): Secondary | ICD-10-CM

## 2022-05-17 DIAGNOSIS — Z131 Encounter for screening for diabetes mellitus: Secondary | ICD-10-CM

## 2022-05-17 DIAGNOSIS — Z0000 Encounter for general adult medical examination without abnormal findings: Secondary | ICD-10-CM

## 2022-05-17 DIAGNOSIS — Z13220 Encounter for screening for lipoid disorders: Secondary | ICD-10-CM

## 2022-06-11 ENCOUNTER — Other Ambulatory Visit: Payer: Self-pay | Admitting: Family Medicine

## 2022-06-11 DIAGNOSIS — M1A0710 Idiopathic chronic gout, right ankle and foot, without tophus (tophi): Secondary | ICD-10-CM

## 2022-06-11 DIAGNOSIS — M10262 Drug-induced gout, left knee: Secondary | ICD-10-CM

## 2022-06-11 NOTE — Telephone Encounter (Signed)
Requested medications are due for refill today.  Unsure per sig  Requested medications are on the active medications list.  yes  Last refill. 05/16/2022 #30 0 rf  Future visit scheduled.   yes  Notes to clinic.  Please review for refill.    Requested Prescriptions  Pending Prescriptions Disp Refills   colchicine 0.6 MG tablet [Pharmacy Med Name: COLCHICINE 0.6 MG TABLET] 30 tablet 0    Sig: TAKE 1 TABLET BY MOUTH DAILY. FOR 30 DAYS FOR PREVENTION, WILL RE ORDER AS NEEDED FLARE IN FUTURE.     Endocrinology:  Gout Agents - colchicine Failed - 06/11/2022 10:45 AM      Failed - Cr in normal range and within 360 days    Creat  Date Value Ref Range Status  09/17/2019 1.22 0.60 - 1.35 mg/dL Final         Failed - ALT in normal range and within 360 days    ALT  Date Value Ref Range Status  09/17/2019 12 9 - 46 U/L Final         Failed - AST in normal range and within 360 days    AST  Date Value Ref Range Status  09/17/2019 14 10 - 40 U/L Final         Failed - CBC within normal limits and completed in the last 12 months    WBC  Date Value Ref Range Status  09/17/2019 5.7 3.8 - 10.8 Thousand/uL Final   RBC  Date Value Ref Range Status  09/17/2019 4.88 4.20 - 5.80 Million/uL Final   Hemoglobin  Date Value Ref Range Status  09/17/2019 14.4 13.2 - 17.1 g/dL Final   HCT  Date Value Ref Range Status  09/17/2019 42.4 38.5 - 50.0 % Final   MCHC  Date Value Ref Range Status  09/17/2019 34.0 32.0 - 36.0 g/dL Final   [facility]  Date Value Ref Range Status  09/17/2019 29.5 27.0 - 33.0 pg Final   MCV  Date Value Ref Range Status  09/17/2019 86.9 80.0 - 100.0 fL Final   No results found for: "PLTCOUNTKUC", "LABPLAT", "POCPLA" RDW  Date Value Ref Range Status  09/17/2019 12.2 11.0 - 15.0 % Final         Passed - Valid encounter within last 12 months    Recent Outpatient Visits           3 weeks ago Acute drug-induced gout of left knee   [place], Alexander J, DO   2 years ago Acute non-recurrent sinusitis, unspecified location   [facility] [place], Lupita Raider, FNP   2 years ago Idiopathic chronic gout of right foot without tophus   [place], DO   2 years ago Rib sprain, initial encounter   [place], DO   3 years ago Chronic joint pain   [facility] Olin Hauser, DO       Future Appointments             In 1 month [place], Devonne Doughty, DO [facility], [facility]

## 2022-07-10 ENCOUNTER — Other Ambulatory Visit: Payer: Self-pay

## 2022-07-10 DIAGNOSIS — M1A0710 Idiopathic chronic gout, right ankle and foot, without tophus (tophi): Secondary | ICD-10-CM

## 2022-07-10 DIAGNOSIS — Z0000 Encounter for general adult medical examination without abnormal findings: Secondary | ICD-10-CM

## 2022-07-10 DIAGNOSIS — Z131 Encounter for screening for diabetes mellitus: Secondary | ICD-10-CM

## 2022-07-10 DIAGNOSIS — E669 Obesity, unspecified: Secondary | ICD-10-CM

## 2022-07-10 DIAGNOSIS — Z13220 Encounter for screening for lipoid disorders: Secondary | ICD-10-CM

## 2022-07-11 ENCOUNTER — Other Ambulatory Visit: Payer: BC Managed Care – PPO

## 2022-07-11 DIAGNOSIS — Z0000 Encounter for general adult medical examination without abnormal findings: Secondary | ICD-10-CM | POA: Diagnosis not present

## 2022-07-11 DIAGNOSIS — Z131 Encounter for screening for diabetes mellitus: Secondary | ICD-10-CM | POA: Diagnosis not present

## 2022-07-11 DIAGNOSIS — Z13220 Encounter for screening for lipoid disorders: Secondary | ICD-10-CM | POA: Diagnosis not present

## 2022-07-11 DIAGNOSIS — M1A0710 Idiopathic chronic gout, right ankle and foot, without tophus (tophi): Secondary | ICD-10-CM | POA: Diagnosis not present

## 2022-07-12 LAB — CBC WITH DIFFERENTIAL/PLATELET
Absolute Monocytes: 472 cells/uL (ref 200–950)
Basophils Absolute: 29 cells/uL (ref 0–200)
Basophils Relative: 0.7 %
Eosinophils Absolute: 312 cells/uL (ref 15–500)
Eosinophils Relative: 7.6 %
HCT: 40.4 % (ref 38.5–50.0)
Hemoglobin: 14.1 g/dL (ref 13.2–17.1)
Lymphs Abs: 1160 cells/uL (ref 850–3900)
MCH: 30.3 pg (ref 27.0–33.0)
MCHC: 34.9 g/dL (ref 32.0–36.0)
MCV: 86.7 fL (ref 80.0–100.0)
MPV: 11.3 fL (ref 7.5–12.5)
Monocytes Relative: 11.5 %
Neutro Abs: 2128 cells/uL (ref 1500–7800)
Neutrophils Relative %: 51.9 %
Platelets: 202 10*3/uL (ref 140–400)
RBC: 4.66 10*6/uL (ref 4.20–5.80)
RDW: 13.2 % (ref 11.0–15.0)
Total Lymphocyte: 28.3 %
WBC: 4.1 10*3/uL (ref 3.8–10.8)

## 2022-07-12 LAB — HEMOGLOBIN A1C
Hgb A1c MFr Bld: 6.2 % of total Hgb — ABNORMAL HIGH (ref <5.7)
Mean Plasma Glucose: 131 mg/dL
eAG (mmol/L): 7.3 mmol/L

## 2022-07-12 LAB — COMPLETE METABOLIC PANEL WITH GFR
AG Ratio: 1.6 (calc) (ref 1.0–2.5)
ALT: 25 U/L (ref 9–46)
AST: 20 U/L (ref 10–40)
Albumin: 3.9 g/dL (ref 3.6–5.1)
Alkaline phosphatase (APISO): 59 U/L (ref 36–130)
BUN: 18 mg/dL (ref 7–25)
CO2: 26 mmol/L (ref 20–32)
Calcium: 9.5 mg/dL (ref 8.6–10.3)
Chloride: 105 mmol/L (ref 98–110)
Creat: 1.24 mg/dL (ref 0.60–1.29)
Globulin: 2.5 g/dL (calc) (ref 1.9–3.7)
Glucose, Bld: 113 mg/dL — ABNORMAL HIGH (ref 65–99)
Potassium: 4.3 mmol/L (ref 3.5–5.3)
Sodium: 140 mmol/L (ref 135–146)
Total Bilirubin: 0.4 mg/dL (ref 0.2–1.2)
Total Protein: 6.4 g/dL (ref 6.1–8.1)
eGFR: 74 mL/min/{1.73_m2} (ref >=60)

## 2022-07-12 LAB — LIPID PANEL
Cholesterol: 157 mg/dL (ref <200)
HDL: 33 mg/dL — ABNORMAL LOW (ref >=40)
LDL Cholesterol (Calc): 94 mg/dL (calc)
Non-HDL Cholesterol (Calc): 124 mg/dL (calc) (ref <130)
Total CHOL/HDL Ratio: 4.8 (calc) (ref <5.0)
Triglycerides: 204 mg/dL — ABNORMAL HIGH (ref <150)

## 2022-07-12 LAB — URIC ACID: Uric Acid, Serum: 7.2 mg/dL (ref 4.0–8.0)

## 2022-07-17 ENCOUNTER — Encounter: Payer: Self-pay | Admitting: Family Medicine

## 2022-07-17 DIAGNOSIS — R7309 Other abnormal glucose: Secondary | ICD-10-CM | POA: Insufficient documentation

## 2022-07-18 ENCOUNTER — Other Ambulatory Visit: Payer: Self-pay | Admitting: Family Medicine

## 2022-07-18 ENCOUNTER — Encounter: Payer: Self-pay | Admitting: Family Medicine

## 2022-07-18 ENCOUNTER — Ambulatory Visit (INDEPENDENT_AMBULATORY_CARE_PROVIDER_SITE_OTHER): Payer: BC Managed Care – PPO | Admitting: Family Medicine

## 2022-07-18 VITALS — BP 120/84 | HR 68 | Ht 72.0 in | Wt 310.4 lb

## 2022-07-18 DIAGNOSIS — Z800 Family history of malignant neoplasm of digestive organs: Secondary | ICD-10-CM

## 2022-07-18 DIAGNOSIS — M129 Arthropathy, unspecified: Secondary | ICD-10-CM

## 2022-07-18 DIAGNOSIS — M10262 Drug-induced gout, left knee: Secondary | ICD-10-CM | POA: Diagnosis not present

## 2022-07-18 DIAGNOSIS — Z1211 Encounter for screening for malignant neoplasm of colon: Secondary | ICD-10-CM

## 2022-07-18 DIAGNOSIS — Z6841 Body Mass Index (BMI) 40.0 and over, adult: Secondary | ICD-10-CM

## 2022-07-18 DIAGNOSIS — R7309 Other abnormal glucose: Secondary | ICD-10-CM

## 2022-07-18 DIAGNOSIS — E781 Pure hyperglyceridemia: Secondary | ICD-10-CM

## 2022-07-18 DIAGNOSIS — Z0000 Encounter for general adult medical examination without abnormal findings: Secondary | ICD-10-CM

## 2022-07-18 DIAGNOSIS — M1A09X0 Idiopathic chronic gout, multiple sites, without tophus (tophi): Secondary | ICD-10-CM

## 2022-07-18 DIAGNOSIS — M1A0710 Idiopathic chronic gout, right ankle and foot, without tophus (tophi): Secondary | ICD-10-CM

## 2022-07-18 MED ORDER — PREDNISONE 20 MG PO TABS: ORAL_TABLET | ORAL | 0 refills | Status: DC

## 2022-07-18 MED ORDER — PHENTERMINE HCL 37.5 MG PO CAPS: 37.5000 mg | ORAL_CAPSULE | ORAL | 0 refills | Status: DC

## 2022-07-18 MED ORDER — COLCHICINE 0.6 MG PO TABS: ORAL_TABLET | ORAL | 1 refills | Status: DC

## 2022-07-18 NOTE — Assessment & Plan Note (Signed)
Chronic gout, improved uric acid level to 7.2 (still above goal < 6), prior range >10 Still has breakthrough flares, mild and moderate flares, last 2 weeks ago Multiple joints involved, various sites for flares Unable to tolerate higher dose Allopurinol '300mg'$  with side effect joint pain precipitated flare despite colchicine.  Known history of gout for years  Plan: 1. Continue HALF dose Allopurinol '300mg'$  = '150mg'$  dose daily, may go back up to '300mg'$  if/when ready to re-try, keep on colchicine with it for longer as prevention 2. Re order Colchicine, may use it as AS NEEDED for mild flares as well - will rx Prednisone as back up plan if new acute severe flare  Lifestyle / dietary precautions given  Follow-up 3 months repeat Uric Acid  Discussed next steps if unable to manage his uric acid levels and breakthrough flares, we can consider other med options Uloric or referral to Rheumatology. He reports very significant fam history of father with severe gout, and other fam members with rheumatoid arthritis and lupus.

## 2022-07-18 NOTE — Assessment & Plan Note (Signed)
Morbid obesity BMI >42 Comorbid factors Pre Diabetes, Gout, Arthritis  Discussed lifestyle management, focus on diet. Additionally we agree he may pursue medication. Phentermine 37.'5mg'$  daily may be pursued, reviewed risk benefit side effects especially HYPERTENSION and Cardioavasculr risk. This is only short term 1-3 month trial, initial rx is usually 30 day course, follow up notify office if want to pursue refills Future consider management with other weight loss therapy as indicated

## 2022-07-18 NOTE — Assessment & Plan Note (Signed)
Elevated A1c to 6.2 Reviewed PreDM Discussed lifestyle management diet exercise (but limited by gout)

## 2022-07-18 NOTE — Patient Instructions (Addendum)
Thank you for coming to the office today.  Referral today for early colon cancer screening, age 42+, since father dx colon cancer age 12.  If you do not hear back within 2 weeks, then you can call them to schedule.  [facility]) [address] [place], [place] [address] Phone: [phone]  Recent Labs    07/11/22 0806  HGBA1C 6.2*    Pre Diabetes range now. Goal to treat the weight and lifestyle, low carb low starch low sugar  ------  2 more weeks increase Allopurinol back up to '300mg'$ , keep on Colchicine daily Ordered Prednisone as back up plan  Future consider Rheumatology specialist.  ---------- Call insurance find cost and coverage of the following - check the following: - Drug Tier, Preferred List, On Formulary - All will require a "Prior Authorization" from [place] first, before you can find out the cost - Find out if there is "Step Therapy" (other medicines required before you can try these)  Once you pick the one you want to try, let me know - we can get a sample ready IF we have it in stock. Then try it - and before running out of medicine, contact me back to order your Rx so we have time to get it processed.  For Weight Loss / Obesity only  Wegovy (same as Ozempic) weekly injection - start 0.'25mg'$  weekly, 1 dose per pen, single use, auto-injector  2. Saxenda - DAILY injection - start 0.'6mg'$  injection DAILY, you can increase the dose by 1 notch or 0.6 mg per week, if you don't tolerate a dose, can reduce it the next day.  3. Zepbound (same as Mounjaro) weekly injection  4. Contrave - oral medication, appetite suppression has wellbutrin/bupropion and naltrexone in it and it can also help with appetite, it is ordered through a speciality pharmacy. - $99  5. Phentermine - oral medication, older generation med, "burning medication" speeds up heart / blood pressure etc, but generally fairly well tolerated for short term. 1-3 months  approximately. Affordable and generic.  Future make sure your insurance has weight loss coverage  WEIGHT MANAGEMENT  Dr Dennard Nip  [facility] [place], [place] [address] Ph: [phone]  DUE for FASTING BLOOD WORK (no food or drink after midnight before the lab appointment, only water or coffee without cream/sugar on the morning of)  SCHEDULE "Lab Only" visit in the morning at the clinic for lab draw in 3 MONTHS   - Make sure Lab Only appointment is at about 1 week before your next appointment, so that results will be available  For Lab Results, once available within 2-3 days of blood draw, you can can log in to MyChart online to view your results and a brief explanation. Also, we can discuss results at next follow-up visit.    Please schedule a Follow-up Appointment to: Return in about 3 months (around 10/18/2022) for 3 month fasting lab only then 1 week later Follow-up Gout, PreDM.  If you have any other questions or concerns, please feel free to call the office or send a message through [place]. You may also schedule an earlier appointment if necessary.  Additionally, you may be receiving a survey about your experience at our office within a few days to 1 week by e-mail or mail. We value your feedback.  Nobie Putnam, DO [place]

## 2022-07-18 NOTE — Progress Notes (Signed)
Subjective:    Patient ID: Benjamin Mccarty, male    DOB: July 24, 1980, 42 y.o.   MRN: [id number]  Benjamin Mccarty is a 42 y.o. male presenting on 07/18/2022 for Annual Exam   HPI  Here for Annual Physical and Lab Review.  Morbid Obesity BMI >42 Pre-Diabetes Elevated Triglycerides to 204, low HDL, normal LDL Elevated A1c from 5.4 up to 6.2 Lifestyle Diet he tries to improve. He does not overeat or eat late night snacks Limiting fast food and eating out   Gout, chronic multiple joints He had recent acute gout flare with his knee 1-2 weeks prior Tried Colchicine temporary relief, he took some Naproxen with it It did ease up a few days later He has a lot of generalized chronic pain, joint fluid retention, aching Painful to exercise often Tried Allopurinol '300mg'$  + colchicine but had more joint stiffness and symptoms, for a few weeks, then reduced to Allopurinol half of 300 for '150mg'$  daily + colchicine and it was working. Last lab showed improved Uric acid to 7.2 from prior 10.1 see below - He has not seen Rheumatologist - Additional labs done showed positive inflammatory markers ESR 17 and CRP 16.4, otherwise negative rheumatological screening RF, Anti CCP, ANA tests. He has fam history of rheumatological conditions with gout, RA and Lupus.  Health Maintenance: Due for early colon cancer screening age 79+ given father dx age 28, ordered Colonoscopy referral today     07/18/2022    8:38 AM 09/24/2019    8:30 AM 02/26/2019   10:09 AM  Depression screen PHQ 2/9  Decreased Interest 1 0 0  Down, Depressed, Hopeless 0 0 0  PHQ - 2 Score 1 0 0  Altered sleeping 0    Tired, decreased energy 2    Change in appetite 0    Feeling bad or failure about yourself  0    Trouble concentrating 0    Moving slowly or fidgety/restless 0    Suicidal thoughts 0    PHQ-9 Score 3    Difficult doing work/chores Not difficult at all      Past Medical History:  Diagnosis Date   Gout    History  reviewed. No pertinent surgical history. Social History   Socioeconomic History   Marital status: Married    Spouse name: Not on file   Number of children: Not on file   Years of education: Not on file   Highest education level: Not on file  Occupational History   Not on file  Tobacco Use   Smoking status: Never   Smokeless tobacco: Never  Substance and Sexual Activity   Alcohol use: No   Drug use: Never   Sexual activity: Not on file  Other Topics Concern   Not on file  Social History Narrative   Not on file   Social Determinants of Health   Financial Resource Strain: Not on file  Food Insecurity: Not on file  Transportation Needs: Not on file  Physical Activity: Not on file  Stress: Not on file  Social Connections: Not on file  Intimate Partner Violence: Not on file   Family History  Problem Relation Age of Onset   Thyroid disease Mother    Lupus Mother    Diabetes Mother    Rheum arthritis Mother    Diabetes Father    Colon cancer Father 69   Rheum arthritis Maternal Grandmother    Rheum arthritis Maternal Great-grandmother    Current Outpatient Medications on  File Prior to Visit  Medication Sig   allopurinol (ZYLOPRIM) 300 MG tablet Take 1 tablet (300 mg total) by mouth daily.   fluticasone (FLONASE) 50 MCG/ACT nasal spray Place 2 sprays into both nostrils daily.   naproxen sodium (ALEVE) 220 MG tablet Take 220 mg by mouth as needed.   No current facility-administered medications on file prior to visit.    Review of Systems  Constitutional:  Negative for activity change, appetite change, chills, diaphoresis, fatigue and fever.  HENT:  Negative for congestion and hearing loss.   Eyes:  Negative for visual disturbance.  Respiratory:  Negative for cough, chest tightness, shortness of breath and wheezing.   Cardiovascular:  Negative for chest pain, palpitations and leg swelling.  Gastrointestinal:  Negative for abdominal pain, constipation, diarrhea, nausea  and vomiting.  Genitourinary:  Negative for dysuria, frequency and hematuria.  Musculoskeletal:  Negative for arthralgias and neck pain.  Skin:  Negative for rash.  Neurological:  Negative for dizziness, weakness, light-headedness, numbness and headaches.  Hematological:  Negative for adenopathy.  Psychiatric/Behavioral:  Negative for behavioral problems, dysphoric mood and sleep disturbance.    Per HPI unless specifically indicated above      Objective:    BP 120/84   Pulse 68   Ht 6' (1.829 m)   Wt (!) 310 lb 6.4 oz (140.8 kg)   SpO2 99%   BMI 42.10 kg/m   Wt Readings from Last 3 Encounters:  07/18/22 (!) 310 lb 6.4 oz (140.8 kg)  05/16/22 (!) 316 lb (143.3 kg)  09/24/19 275 lb (124.7 kg)    Physical Exam Vitals and nursing note reviewed.  Constitutional:      General: He is not in acute distress.    Appearance: He is well-developed. He is not diaphoretic.     Comments: Well-appearing, comfortable, cooperative  HENT:     Head: Normocephalic and atraumatic.  Eyes:     General:        Right eye: No discharge.        Left eye: No discharge.     Conjunctiva/sclera: Conjunctivae normal.     Pupils: Pupils are equal, round, and reactive to light.  Neck:     Thyroid: No thyromegaly.  Cardiovascular:     Rate and Rhythm: Normal rate and regular rhythm.     Pulses: Normal pulses.     Heart sounds: Normal heart sounds. No murmur heard. Pulmonary:     Effort: Pulmonary effort is normal. No respiratory distress.     Breath sounds: Normal breath sounds. No wheezing or rales.  Abdominal:     General: Bowel sounds are normal. There is no distension.     Palpations: Abdomen is soft. There is no mass.     Tenderness: There is no abdominal tenderness.  Musculoskeletal:        General: No tenderness. Normal range of motion.     Cervical back: Normal range of motion and neck supple.     Comments: Upper / Lower Extremities: - Normal muscle tone, strength bilateral upper  extremities 5/5, lower extremities 5/5  Lymphadenopathy:     Cervical: No cervical adenopathy.  Skin:    General: Skin is warm and dry.     Findings: Rash (stable chronic port wine stain on Left face) present. No erythema.  Neurological:     Mental Status: He is alert and oriented to person, place, and time.     Comments: Distal sensation intact to light touch all extremities  Psychiatric:  Mood and Affect: Mood normal.        Behavior: Behavior normal.        Thought Content: Thought content normal.     Comments: Well groomed, good eye contact, normal speech and thoughts       Results for orders placed or performed in visit on 07/10/22  Uric acid  Result Value Ref Range   Uric Acid, Serum 7.2 4.0 - 8.0 mg/dL  Hemoglobin A1c  Result Value Ref Range   Hgb A1c MFr Bld 6.2 (H) <5.7 % of total Hgb   Mean Plasma Glucose 131 mg/dL   eAG (mmol/L) 7.3 mmol/L  Lipid panel  Result Value Ref Range   Cholesterol 157 <200 mg/dL   HDL 33 (L) > OR = 40 mg/dL   Triglycerides 204 (H) <150 mg/dL   LDL Cholesterol (Calc) 94 mg/dL (calc)   Total CHOL/HDL Ratio 4.8 <5.0 (calc)   Non-HDL Cholesterol (Calc) 124 <130 mg/dL (calc)  CBC with Differential/Platelet  Result Value Ref Range   WBC 4.1 3.8 - 10.8 Thousand/uL   RBC 4.66 4.20 - 5.80 Million/uL   Hemoglobin 14.1 13.2 - 17.1 g/dL   HCT 40.4 38.5 - 50.0 %   MCV 86.7 80.0 - 100.0 fL   MCH 30.3 27.0 - 33.0 pg   MCHC 34.9 32.0 - 36.0 g/dL   RDW 13.2 11.0 - 15.0 %   Platelets 202 140 - 400 Thousand/uL   MPV 11.3 7.5 - 12.5 fL   Neutro Abs 2,128 1,500 - 7,800 cells/uL   Lymphs Abs 1,160 850 - 3,900 cells/uL   Absolute Monocytes 472 200 - 950 cells/uL   Eosinophils Absolute 312 15 - 500 cells/uL   Basophils Absolute 29 0 - 200 cells/uL   Neutrophils Relative % 51.9 %   Total Lymphocyte 28.3 %   Monocytes Relative 11.5 %   Eosinophils Relative 7.6 %   Basophils Relative 0.7 %  COMPLETE METABOLIC PANEL WITH GFR  Result Value Ref  Range   Glucose, Bld 113 (H) 65 - 99 mg/dL   BUN 18 7 - 25 mg/dL   Creat 1.24 0.60 - 1.29 mg/dL   eGFR 74 > OR = 60 mL/min/1.83m   BUN/Creatinine Ratio SEE NOTE: 6 - 22 (calc)   Sodium 140 135 - 146 mmol/L   Potassium 4.3 3.5 - 5.3 mmol/L   Chloride 105 98 - 110 mmol/L   CO2 26 20 - 32 mmol/L   Calcium 9.5 8.6 - 10.3 mg/dL   Total Protein 6.4 6.1 - 8.1 g/dL   Albumin 3.9 3.6 - 5.1 g/dL   Globulin 2.5 1.9 - 3.7 g/dL (calc)   AG Ratio 1.6 1.0 - 2.5 (calc)   Total Bilirubin 0.4 0.2 - 1.2 mg/dL   Alkaline phosphatase (APISO) 59 36 - 130 U/L   AST 20 10 - 40 U/L   ALT 25 9 - 46 U/L      Assessment & Plan:   Problem List Items Addressed This Visit     Arthritis, multiple joint involvement   Elevated hemoglobin A1c    Elevated A1c to 6.2 Reviewed PreDM Discussed lifestyle management diet exercise (but limited by gout)      Gout, chronic    Chronic gout, improved uric acid level to 7.2 (still above goal < 6), prior range >10 Still has breakthrough flares, mild and moderate flares, last 2 weeks ago Multiple joints involved, various sites for flares Unable to tolerate higher dose Allopurinol '300mg'$  with side effect joint  pain precipitated flare despite colchicine.  Known history of gout for years  Plan: 1. Continue HALF dose Allopurinol '300mg'$  = '150mg'$  dose daily, may go back up to '300mg'$  if/when ready to re-try, keep on colchicine with it for longer as prevention 2. Re order Colchicine, may use it as AS NEEDED for mild flares as well - will rx Prednisone as back up plan if new acute severe flare  Lifestyle / dietary precautions given  Follow-up 3 months repeat Uric Acid  Discussed next steps if unable to manage his uric acid levels and breakthrough flares, we can consider other med options Uloric or referral to Rheumatology. He reports very significant fam history of father with severe gout, and other fam members with rheumatoid arthritis and lupus.      Relevant Medications    colchicine 0.6 MG tablet   Morbid obesity with BMI of 40.0-44.9, adult (HCC)    Morbid obesity BMI >42 Comorbid factors Pre Diabetes, Gout, Arthritis  Discussed lifestyle management, focus on diet. Additionally we agree he may pursue medication. Phentermine 37.'5mg'$  daily may be pursued, reviewed risk benefit side effects especially HYPERTENSION and Cardioavasculr risk. This is only short term 1-3 month trial, initial rx is usually 30 day course, follow up notify office if want to pursue refills Future consider management with other weight loss therapy as indicated      Relevant Medications   phentermine 37.5 MG capsule   Other Visit Diagnoses     Annual physical exam    -  Primary   Screening for colon cancer       Relevant Orders   Ambulatory referral to Gastroenterology   Family history of colon cancer in father       Relevant Orders   Ambulatory referral to Gastroenterology   Acute drug-induced gout of left knee       Relevant Medications   colchicine 0.6 MG tablet   predniSONE (DELTASONE) 20 MG tablet       Updated Health Maintenance information Reviewed recent lab results with patient Encouraged improvement to lifestyle with diet and exercise Goal of weight loss   Referral today for early colon cancer screening, age 11+, since father dx colon cancer age 58.  Orders Placed This Encounter  Procedures   Ambulatory referral to Gastroenterology    Referral Priority:   Routine    Referral Type:   Consultation    Referral Reason:   Specialty Services Required    Number of Visits Requested:   1     Meds ordered this encounter  Medications   colchicine 0.6 MG tablet    Sig: TAKE 1 TABLET BY MOUTH DAILY FOR PREVENTION. If acute flare may take 2 at once and repeat dose in 2 hours later, then take 1 daily    Dispense:  90 tablet    Refill:  1   predniSONE (DELTASONE) 20 MG tablet    Sig: Take daily with food. Start with '60mg'$  (3 pills) x 2 days, then reduce to '40mg'$  (2  pills) x 2 days, then '20mg'$  (1 pill) x 3 days    Dispense:  13 tablet    Refill:  0   phentermine 37.5 MG capsule    Sig: Take 1 capsule (37.5 mg total) by mouth every morning.    Dispense:  30 capsule    Refill:  0      Follow up plan: Return in about 3 months (around 10/18/2022) for 3 month fasting lab only then 1 week later  Follow-up Gout, PreDM.  3 month, repeat A1c, TSH Uric acid  Nobie Putnam, DO [facility] 07/18/2022, 8:40 AM

## 2022-07-22 ENCOUNTER — Telehealth: Payer: Self-pay

## 2022-07-22 ENCOUNTER — Other Ambulatory Visit: Payer: Self-pay

## 2022-07-22 DIAGNOSIS — Z800 Family history of malignant neoplasm of digestive organs: Secondary | ICD-10-CM

## 2022-07-22 DIAGNOSIS — Z1211 Encounter for screening for malignant neoplasm of colon: Secondary | ICD-10-CM

## 2022-07-22 MED ORDER — NA SULFATE-K SULFATE-MG SULF 17.5-3.13-1.6 GM/177ML PO SOLN: 1.0000 | Freq: Once | ORAL | 0 refills | Status: AC

## 2022-07-22 NOTE — Telephone Encounter (Signed)
Gastroenterology Pre-Procedure Review  Request Date: 08/05/22 Requesting Physician: Dr. Vicente Males  PATIENT REVIEW QUESTIONS: The patient responded to the following health history questions as indicated:    Pt has been advised to stop Phentermine 1 day before colonoscopy.  1. Are you having any GI issues? no 2. Do you have a personal history of Polyps? no 3. Do you have a family history of Colon Cancer or Polyps? yes (father colon cancer age 42) 39. Diabetes Mellitus?  Pre-diabetic 5. Joint replacements in the past 12 months?no 6. Major health problems in the past 3 months?no 7. Any artificial heart valves, MVP, or defibrillator?no    MEDICATIONS & ALLERGIES:    Patient reports the following regarding taking any anticoagulation/antiplatelet therapy:   Plavix, Coumadin, Eliquis, Xarelto, Lovenox, Pradaxa, Brilinta, or Effient? no Aspirin? no  Patient confirms/reports the following medications:  Current Outpatient Medications  Medication Sig Dispense Refill   allopurinol (ZYLOPRIM) 300 MG tablet Take 1 tablet (300 mg total) by mouth daily. 90 tablet 1   colchicine 0.6 MG tablet TAKE 1 TABLET BY MOUTH DAILY FOR PREVENTION. If acute flare may take 2 at once and repeat dose in 2 hours later, then take 1 daily 90 tablet 1   fluticasone (FLONASE) 50 MCG/ACT nasal spray Place 2 sprays into both nostrils daily.     naproxen sodium (ALEVE) 220 MG tablet Take 220 mg by mouth as needed.     phentermine 37.5 MG capsule Take 1 capsule (37.5 mg total) by mouth every morning. 30 capsule 0   predniSONE (DELTASONE) 20 MG tablet Take daily with food. Start with '60mg'$  (3 pills) x 2 days, then reduce to '40mg'$  (2 pills) x 2 days, then '20mg'$  (1 pill) x 3 days 13 tablet 0   No current facility-administered medications for this visit.    Patient confirms/reports the following allergies:  No Known Allergies  No orders of the defined types were placed in this encounter.   AUTHORIZATION INFORMATION Primary  Insurance: 1D#: Group #:  Secondary Insurance: 1D#: Group #:  SCHEDULE INFORMATION: Date: 08/04/22 Time: Location: [facility]

## 2022-08-05 ENCOUNTER — Ambulatory Visit: Payer: BC Managed Care – PPO

## 2022-08-05 ENCOUNTER — Other Ambulatory Visit: Payer: Self-pay

## 2022-08-05 ENCOUNTER — Encounter: Payer: Self-pay | Admitting: Gastroenterology

## 2022-08-05 ENCOUNTER — Ambulatory Visit
Admission: RE | Admit: 2022-08-05 | Discharge: 2022-08-05 | Disposition: A | Discharge status: Home / Self Care | Payer: BC Managed Care – PPO | Source: Ambulatory Visit | Attending: Gastroenterology | Admitting: Gastroenterology

## 2022-08-05 ENCOUNTER — Encounter
Admission: RE | Disposition: A | Discharge status: Home / Self Care | Payer: Self-pay | Source: Ambulatory Visit | Attending: Gastroenterology

## 2022-08-05 DIAGNOSIS — Z1211 Encounter for screening for malignant neoplasm of colon: Secondary | ICD-10-CM | POA: Insufficient documentation

## 2022-08-05 DIAGNOSIS — K5730 Diverticulosis of large intestine without perforation or abscess without bleeding: Secondary | ICD-10-CM | POA: Diagnosis not present

## 2022-08-05 DIAGNOSIS — D126 Benign neoplasm of colon, unspecified: Secondary | ICD-10-CM | POA: Diagnosis not present

## 2022-08-05 DIAGNOSIS — D123 Benign neoplasm of transverse colon: Secondary | ICD-10-CM | POA: Diagnosis not present

## 2022-08-05 DIAGNOSIS — Z800 Family history of malignant neoplasm of digestive organs: Secondary | ICD-10-CM

## 2022-08-05 DIAGNOSIS — K635 Polyp of colon: Secondary | ICD-10-CM | POA: Diagnosis not present

## 2022-08-05 HISTORY — PX: COLONOSCOPY WITH PROPOFOL: SHX5780

## 2022-08-05 SURGERY — COLONOSCOPY WITH PROPOFOL
Anesthesia: General

## 2022-08-05 MED ORDER — PROPOFOL 10 MG/ML IV BOLUS
INTRAVENOUS | Status: AC
  Filled 2022-08-05: qty 20

## 2022-08-05 MED ORDER — PROPOFOL 1000 MG/100ML IV EMUL
INTRAVENOUS | Status: AC
  Filled 2022-08-05: qty 100

## 2022-08-05 MED ORDER — LIDOCAINE HCL (CARDIAC) PF 100 MG/5ML IV SOSY
PREFILLED_SYRINGE | INTRAVENOUS | Status: DC | PRN
  Administered 2022-08-05: 100 mg via INTRAVENOUS

## 2022-08-05 MED ORDER — LIDOCAINE HCL (PF) 2 % IJ SOLN
INTRAMUSCULAR | Status: AC
  Filled 2022-08-05: qty 5

## 2022-08-05 MED ORDER — PROPOFOL 500 MG/50ML IV EMUL
INTRAVENOUS | Status: DC | PRN
  Administered 2022-08-05: 150 ug/kg/min via INTRAVENOUS

## 2022-08-05 MED ORDER — SODIUM CHLORIDE 0.9 % IV SOLN: INTRAVENOUS | Status: DC

## 2022-08-05 MED ORDER — PROPOFOL 10 MG/ML IV BOLUS
INTRAVENOUS | Status: DC | PRN
  Administered 2022-08-05: 130 mg via INTRAVENOUS

## 2022-08-05 MED ORDER — STERILE WATER FOR IRRIGATION IR SOLN
Status: DC | PRN
  Administered 2022-08-05: 60 mL

## 2022-08-05 NOTE — Transfer of Care (Signed)
Immediate Anesthesia Transfer of Care Note  Patient: Benjamin Mccarty  Procedure(s) Performed: COLONOSCOPY WITH PROPOFOL  Patient Location: PACU and Endoscopy Unit  Anesthesia Type:General  Level of Consciousness: drowsy and patient cooperative  Airway & Oxygen Therapy: Patient Spontanous Breathing and Patient connected to nasal cannula oxygen  Post-op Assessment: Report given to RN and Post -op Vital signs reviewed and stable  Post vital signs: Reviewed and stable  Last Vitals:  Vitals Value Taken Time  BP 108/75 08/05/22 0838  Temp 36.3 C 08/05/22 0838  Pulse 74 08/05/22 0838  Resp 17 08/05/22 0838  SpO2 97 % 08/05/22 0838  Vitals shown include unvalidated device data.  Last Pain:  Vitals:   08/05/22 0838  TempSrc: Temporal  PainSc: Asleep         Complications: No notable events documented.

## 2022-08-05 NOTE — Anesthesia Postprocedure Evaluation (Signed)
Anesthesia Post Note  Patient: Benjamin Mccarty  Procedure(s) Performed: COLONOSCOPY WITH PROPOFOL  Patient location during evaluation: Endoscopy Anesthesia Type: General Level of consciousness: awake and alert Pain management: pain level controlled Vital Signs Assessment: post-procedure vital signs reviewed and stable Respiratory status: spontaneous breathing, nonlabored ventilation, respiratory function stable and patient connected to nasal cannula oxygen Cardiovascular status: blood pressure returned to baseline and stable Postop Assessment: no apparent nausea or vomiting Anesthetic complications: no   No notable events documented.   Last Vitals:  Vitals:   08/05/22 0732 08/05/22 0838  BP: (!) 128/91 108/75  Pulse: 63 76  Resp: 18 17  Temp: (!) 36.1 C (!) 36.3 C  SpO2: 98% 97%    Last Pain:  Vitals:   08/05/22 0858  TempSrc:   PainSc: 0-No pain                 Arita Miss

## 2022-08-05 NOTE — Op Note (Signed)
[facility] Gastroenterology Patient Name: Benjamin Mccarty Procedure Date: 08/05/2022 8:08 AM MRN: [id number] Account #: [id number] Date of Birth: 1980/07/04 Admit Type: Outpatient Age: 42 Room: [facility] ENDO ROOM 1 Gender: Male Note Status: Finalized Instrument Name: Jasper Riling [id number] Procedure:             Colonoscopy Indications:           Screening for colorectal malignant neoplasm, Screening                         in patient at increased risk: Family history of                         1st-degree relative with colorectal cancer before age                         29 years Providers:             Jonathon Bellows MD, MD Referring MD:          Olin Hauser (Referring MD) Medicines:             Monitored Anesthesia Care Complications:         No immediate complications. Procedure:             Pre-Anesthesia Assessment:                        - Prior to the procedure, a History and Physical was                         performed, and patient medications, allergies and                         sensitivities were reviewed. The patient's tolerance                         of previous anesthesia was reviewed.                        - The risks and benefits of the procedure and the                         sedation options and risks were discussed with the                         patient. All questions were answered and informed                         consent was obtained.                        - The risks and benefits of the procedure and the                         sedation options and risks were discussed with the                         patient. All questions were answered and informed  consent was obtained.                        After obtaining informed consent, the colonoscope was                         passed under direct vision. Throughout the procedure,                         the patient's blood pressure, pulse, and oxygen                          saturations were monitored continuously. The                         Colonoscope was introduced through the anus and                         advanced to the the cecum, identified by the                         appendiceal orifice. The colonoscopy was performed                         with ease. The patient tolerated the procedure well.                         The quality of the bowel preparation was excellent.                         The ileocecal valve, appendiceal orifice, and rectum                         were photographed. Findings:      The perianal and digital rectal examinations were normal.      Two sessile polyps were found in the transverse colon and ascending       colon. The polyps were 5 to 7 mm in size. These polyps were removed with       a cold snare. Resection and retrieval were complete.      The exam was otherwise without abnormality on direct and retroflexion       views.      Multiple small-mouthed diverticula were found in the ascending colon. Impression:            - Two 5 to 7 mm polyps in the transverse colon and in                         the ascending colon, removed with a cold snare.                         Resected and retrieved.                        - The examination was otherwise normal on direct and                         retroflexion views. Recommendation:        - Discharge patient to home (with escort).                        -  Resume previous diet.                        - Continue present medications.                        - Await pathology results.                        - Repeat colonoscopy in 5 years for surveillance. Procedure Code(s):     --- Professional ---                        [phone], Colonoscopy, flexible; with removal of                         tumor(s), polyp(s), or other lesion(s) by snare                         technique Diagnosis Code(s):     --- Professional ---                        Z12.11, Encounter for  screening for malignant neoplasm                         of colon                        Z80.0, Family history of malignant neoplasm of                         digestive organs                        D12.3, Benign neoplasm of transverse colon (hepatic                         flexure or splenic flexure)                        D12.2, Benign neoplasm of ascending colon CPT copyright 2022 American Medical Association. All rights reserved. The codes documented in this report are preliminary and upon coder review may  be revised to meet current compliance requirements. Jonathon Bellows, MD Jonathon Bellows MD, MD 08/05/2022 8:38:44 AM This report has been signed electronically. Number of Addenda: 0 Note Initiated On: 08/05/2022 8:08 AM Scope Withdrawal Time: 0 hours 8 minutes 55 seconds  Total Procedure Duration: 0 hours 13 minutes 55 seconds  Estimated Blood Loss:  Estimated blood loss: none. Estimated blood loss: none.      [facility]

## 2022-08-05 NOTE — Anesthesia Preprocedure Evaluation (Signed)
Anesthesia Evaluation  Patient identified by MRN, date of birth, ID band Patient awake    Reviewed: Allergy & Precautions, NPO status , Patient's Chart, lab work & pertinent test results  History of Anesthesia Complications Negative for: history of anesthetic complications  Airway Mallampati: II  TM Distance: >3 FB Neck ROM: Full    Dental  (+) Poor Dentition, Missing   Pulmonary neg pulmonary ROS, neg sleep apnea, neg COPD, Patient abstained from smoking.Not current smoker   Pulmonary exam normal breath sounds clear to auscultation       Cardiovascular Exercise Tolerance: Good METS(-) hypertension(-) CAD and (-) Past MI negative cardio ROS (-) dysrhythmias  Rhythm:Regular Rate:Normal - Systolic murmurs    Neuro/Psych negative neurological ROS  negative psych ROS   GI/Hepatic ,neg GERD  ,,(+)     (-) substance abuse    Endo/Other  neg diabetes    Renal/GU negative Renal ROS     Musculoskeletal   Abdominal  (+) + obese  Peds  Hematology   Anesthesia Other Findings Past Medical History: No date: Gout  Reproductive/Obstetrics                             Anesthesia Physical Anesthesia Plan  ASA: 2  Anesthesia Plan: General   Post-op Pain Management: Minimal or no pain anticipated   Induction: Intravenous  PONV Risk Score and Plan: 2 and Propofol infusion, TIVA and Ondansetron  Airway Management Planned: Nasal Cannula  Additional Equipment: None  Intra-op Plan:   Post-operative Plan:   Informed Consent: I have reviewed the patients History and Physical, chart, labs and discussed the procedure including the risks, benefits and alternatives for the proposed anesthesia with the patient or authorized representative who has indicated his/her understanding and acceptance.     Dental advisory given  Plan Discussed with: CRNA and Surgeon  Anesthesia Plan Comments: (Discussed  risks of anesthesia with patient, including possibility of difficulty with spontaneous ventilation under anesthesia necessitating airway intervention, PONV, and rare risks such as cardiac or respiratory or neurological events, and allergic reactions. Discussed the role of CRNA in patient's perioperative care. Patient understands.)       Anesthesia Quick Evaluation

## 2022-08-05 NOTE — H&P (Signed)
Jonathon Bellows, MD [address] [place], [place], [place], [address] [address], [place], [place], [place], [address] Phone: [phone]  Fax: [phone]  Primary Care Physician:  Olin Hauser, DO   Pre-Procedure History & Physical: HPI:  Benjamin Mccarty is a 42 y.o. male is here for an colonoscopy.   Past Medical History:  Diagnosis Date   Gout     History reviewed. No pertinent surgical history.  Prior to Admission medications   Medication Sig Start Date End Date Taking? Authorizing Provider  allopurinol (ZYLOPRIM) 300 MG tablet Take 1 tablet (300 mg total) by mouth daily. 05/16/22  Yes Karamalegos, Alexander J, DO  colchicine 0.6 MG tablet TAKE 1 TABLET BY MOUTH DAILY FOR PREVENTION. If acute flare may take 2 at once and repeat dose in 2 hours later, then take 1 daily 07/18/22  Yes Karamalegos, Alexander J, DO  fluticasone (FLONASE) 50 MCG/ACT nasal spray Place 2 sprays into both nostrils daily.   Yes [provider]  naproxen sodium (ALEVE) 220 MG tablet Take 220 mg by mouth as needed.   Yes [provider]  phentermine 37.5 MG capsule Take 1 capsule (37.5 mg total) by mouth every morning. 07/18/22  Yes Karamalegos, Devonne Doughty, DO  predniSONE (DELTASONE) 20 MG tablet Take daily with food. Start with 60mg  (3 pills) x 2 days, then reduce to 40mg  (2 pills) x 2 days, then 20mg  (1 pill) x 3 days 07/18/22  Yes Olin Hauser, DO    Allergies as of 07/23/2022   (No Known Allergies)    Family History  Problem Relation Age of Onset   Thyroid disease Mother    Lupus Mother    Diabetes Mother    Rheum arthritis Mother    Diabetes Father    Colon cancer Father 68   Rheum arthritis Maternal Grandmother    Rheum arthritis Maternal Great-grandmother     Social History   Socioeconomic History   Marital status: Married    Spouse name: Not on file   Number of children: Not on file   Years of education: Not on file   Highest  education level: Not on file  Occupational History   Not on file  Tobacco Use   Smoking status: Never   Smokeless tobacco: Never  Vaping Use   Vaping Use: Never used  Substance and Sexual Activity   Alcohol use: No   Drug use: Never   Sexual activity: Not on file  Other Topics Concern   Not on file  Social History Narrative   Not on file   Social Determinants of Health   Financial Resource Strain: Not on file  Food Insecurity: Not on file  Transportation Needs: Not on file  Physical Activity: Not on file  Stress: Not on file  Social Connections: Not on file  Intimate Partner Violence: Not on file    Review of Systems: See HPI, otherwise negative ROS  Physical Exam: BP (!) 128/91   Pulse 63   Temp (!) 96.9 F (36.1 C) (Temporal)   Resp 18   Ht 6' (1.829 m)   Wt 132.9 kg   SpO2 98%   BMI 39.74 kg/m  General:   Alert,  pleasant and cooperative in NAD Head:  Normocephalic and atraumatic. Neck:  Supple; no masses or thyromegaly. Lungs:  Clear throughout to auscultation, normal respiratory effort.    Heart:  +S1, +S2, Regular rate and rhythm, No edema. Abdomen:  Soft, nontender and  nondistended. Normal bowel sounds, without guarding, and without rebound.   Neurologic:  Alert and  oriented x4;  grossly normal neurologically.  Impression/Plan: Benjamin Mccarty is here for an colonoscopy to be performed for Screening colonoscopy father had colon cANcer at 64  Risks, benefits, limitations, and alternatives regarding  colonoscopy have been reviewed with the patient.  Questions have been answered.  All parties agreeable.   Jonathon Bellows, MD  08/05/2022, 7:44 AM

## 2022-08-06 ENCOUNTER — Encounter: Payer: Self-pay | Admitting: Gastroenterology

## 2022-08-06 LAB — SURGICAL PATHOLOGY

## 2022-08-07 ENCOUNTER — Encounter: Payer: Self-pay | Admitting: Gastroenterology

## 2022-08-19 ENCOUNTER — Other Ambulatory Visit: Payer: Self-pay | Admitting: Family Medicine

## 2022-08-19 MED ORDER — PHENTERMINE HCL 37.5 MG PO CAPS: 37.5000 mg | ORAL_CAPSULE | ORAL | 0 refills | Status: DC

## 2022-09-27 ENCOUNTER — Other Ambulatory Visit: Payer: Self-pay | Admitting: Family Medicine

## 2022-09-27 DIAGNOSIS — M10262 Drug-induced gout, left knee: Secondary | ICD-10-CM

## 2022-09-30 MED ORDER — PHENTERMINE HCL 37.5 MG PO CAPS: 37.5000 mg | ORAL_CAPSULE | ORAL | 0 refills | Status: DC

## 2022-10-24 ENCOUNTER — Other Ambulatory Visit: Payer: BC Managed Care – PPO

## 2022-10-24 DIAGNOSIS — E781 Pure hyperglyceridemia: Secondary | ICD-10-CM | POA: Diagnosis not present

## 2022-10-24 DIAGNOSIS — R7309 Other abnormal glucose: Secondary | ICD-10-CM

## 2022-10-24 DIAGNOSIS — M1A09X0 Idiopathic chronic gout, multiple sites, without tophus (tophi): Secondary | ICD-10-CM

## 2022-10-24 DIAGNOSIS — Z6841 Body Mass Index (BMI) 40.0 and over, adult: Secondary | ICD-10-CM | POA: Diagnosis not present

## 2022-10-25 LAB — HEMOGLOBIN A1C
Hgb A1c MFr Bld: 5.9 % of total Hgb — ABNORMAL HIGH (ref <5.7)
Mean Plasma Glucose: 123 mg/dL
eAG (mmol/L): 6.8 mmol/L

## 2022-10-25 LAB — URIC ACID: Uric Acid, Serum: 6.6 mg/dL (ref 4.0–8.0)

## 2022-10-25 LAB — TSH: TSH: 3.26 mIU/L (ref 0.40–4.50)

## 2022-10-31 ENCOUNTER — Encounter: Payer: Self-pay | Admitting: Family Medicine

## 2022-10-31 ENCOUNTER — Ambulatory Visit: Payer: BC Managed Care – PPO | Admitting: Family Medicine

## 2022-10-31 VITALS — BP 118/88 | HR 64 | Ht 72.0 in | Wt 290.0 lb

## 2022-10-31 DIAGNOSIS — M1A0710 Idiopathic chronic gout, right ankle and foot, without tophus (tophi): Secondary | ICD-10-CM

## 2022-10-31 DIAGNOSIS — R7309 Other abnormal glucose: Secondary | ICD-10-CM

## 2022-10-31 DIAGNOSIS — Z6841 Body Mass Index (BMI) 40.0 and over, adult: Secondary | ICD-10-CM

## 2022-10-31 MED ORDER — ALLOPURINOL 300 MG PO TABS: 300.0000 mg | ORAL_TABLET | Freq: Every day | ORAL | 3 refills | Status: DC

## 2022-10-31 MED ORDER — PHENTERMINE HCL 37.5 MG PO CAPS: 37.5000 mg | ORAL_CAPSULE | ORAL | 0 refills | Status: DC

## 2022-10-31 MED ORDER — COLCHICINE 0.6 MG PO TABS: ORAL_TABLET | ORAL | 3 refills | Status: DC

## 2022-10-31 NOTE — Patient Instructions (Addendum)
Thank you for coming to the office today.    For Weight Loss / Obesity only   Wegovy (same as Ozempic) weekly injection - start 0.25mg  weekly, 1 dose per pen, single use, auto-injector   2. Saxenda - DAILY injection - start 0.6mg  injection DAILY, you can increase the dose by 1 notch or 0.6 mg per week, if you don't tolerate a dose, can reduce it the next day.   3. Zepbound (same as Mounjaro) weekly injection   4. Contrave - oral medication, appetite suppression has wellbutrin/bupropion and naltrexone in it and it can also help with appetite, it is ordered through a speciality pharmacy. - $99  -------------------------------------  For Weight Loss / Obesity only  Alternative options  Semaglutide injection (mixed Ozempic) from [facility] 0.25mg  weekly for 4 weeks then increase to 0.5mg  weekly It comes in a vial and a needle syringe, you need to draw up the shot and self admin it weekly Cost is about $200 per month Call them to check pricing and availability  [facility] Address: [address], [place], [place] [address] Phone: [phone]  --------------------------------  [facility] in [place], does not accept insurance. Call to schedule / Walk in to schedule They can do the weekly weight loss injections (same as Ozempic) Pay per visit and per shot. It may be approximately $60-75+ per visit/shot, approximately $200-300 range per month depending  [facility] Address: [address] [place], [place] [address] Phone: [phone]    Please schedule a Follow-up Appointment to: Return in about 9 months (around 07/31/2023) for 9 month fasting lab only then 1 week later Annual Physical.  If you have any other questions or concerns, please feel free to call the office or send a message through MyChart. You may also schedule an earlier appointment if necessary.  Additionally, you may be receiving a survey about your experience at our office  within a few days to 1 week by e-mail or mail. We value your feedback.  Saralyn Pilar, DO [facility], [place]

## 2022-10-31 NOTE — Progress Notes (Signed)
Subjective:    Patient ID: Benjamin Mccarty, male    DOB: December 24, 1980, 42 y.o.   MRN: [id number]  Benjamin Mccarty is a 42 y.o. male presenting on 10/31/2022 for Diabetes and Gout   HPI  Morbid Obesity BMI >39 Pre-Diabetes Improved weight loss 310 down to 290 lbs with improved lifestyle eating activity and while on Phentermine for past 3 months tolerated well. No side effects. Improved A1c 5.9, prior range 6.2   Gout, chronic multiple joints No flare ups in months Doing well overall Reduced Uric Acid 6.6 (prior range 7.2, 10.1) On Colchicine 0.6mg  daily and Allopurinol 300mg  daily, not half tab Out of Colchicine, will re order It was working with improvement in pain symptoms.  - He has not seen Rheumatologist - Additional labs done showed positive inflammatory markers ESR 17 and CRP 16.4, otherwise negative rheumatological screening RF, Anti CCP, ANA tests. He has fam history of rheumatological conditions with gout, RA and Lupus.  Health Maintenance: Colonoscopy 07/2022 Dr Tobi Bastos AGI - 2 polyps, repeat 5 years, approx age 44-47. Fam history of early colon CA.     10/31/2022    8:34 AM 07/18/2022    8:38 AM 09/24/2019    8:30 AM  Depression screen PHQ 2/9  Decreased Interest 1 1 0  Down, Depressed, Hopeless 0 0 0  PHQ - 2 Score 1 1 0  Altered sleeping 0 0   Tired, decreased energy 1 2   Change in appetite 0 0   Feeling bad or failure about yourself  0 0   Trouble concentrating 0 0   Moving slowly or fidgety/restless 0 0   Suicidal thoughts 0 0   PHQ-9 Score 2 3   Difficult doing work/chores  Not difficult at all     Social History   Tobacco Use   Smoking status: Never   Smokeless tobacco: Never  Vaping Use   Vaping Use: Never used  Substance Use Topics   Alcohol use: No   Drug use: Never    Review of Systems Per HPI unless specifically indicated above     Objective:    BP 118/88   Pulse 64   Ht 6' (1.829 m)   Wt 290 lb (131.5 kg)   SpO2 99%   BMI 39.33  kg/m   Wt Readings from Last 3 Encounters:  10/31/22 290 lb (131.5 kg)  08/05/22 293 lb (132.9 kg)  07/18/22 (!) 310 lb 6.4 oz (140.8 kg)    Physical Exam Vitals and nursing note reviewed.  Constitutional:      General: He is not in acute distress.    Appearance: Normal appearance. He is well-developed. He is not diaphoretic.     Comments: Well-appearing, comfortable, cooperative  HENT:     Head: Normocephalic and atraumatic.  Eyes:     General:        Right eye: No discharge.        Left eye: No discharge.     Conjunctiva/sclera: Conjunctivae normal.  Cardiovascular:     Rate and Rhythm: Normal rate.  Pulmonary:     Effort: Pulmonary effort is normal.  Skin:    General: Skin is warm and dry.     Findings: No erythema or rash.  Neurological:     Mental Status: He is alert and oriented to person, place, and time.  Psychiatric:        Mood and Affect: Mood normal.        Behavior: Behavior normal.  Thought Content: Thought content normal.     Comments: Well groomed, good eye contact, normal speech and thoughts       Results for orders placed or performed in visit on 10/24/22  Uric acid  Result Value Ref Range   Uric Acid, Serum 6.6 4.0 - 8.0 mg/dL  TSH  Result Value Ref Range   TSH 3.26 0.40 - 4.50 mIU/L  Hemoglobin A1c  Result Value Ref Range   Hgb A1c MFr Bld 5.9 (H) <5.7 % of total Hgb   Mean Plasma Glucose 123 mg/dL   eAG (mmol/L) 6.8 mmol/L      Assessment & Plan:   Problem List Items Addressed This Visit     Elevated hemoglobin A1c    Improved A1c 6.2 down to 5.9 Reviewed PreDM Discussed lifestyle management diet exercise (but limited by gout)      Gout, chronic - Primary    Chronic gout, improved uric acid level to 6.6 on inc dose allopurinol No breakthrough gout flares Multiple joints involved, various sites for flares Unable to tolerate higher dose Allopurinol 300mg  with side effect joint pain precipitated flare despite  colchicine.  Known history of gout for years  Plan: Continue Allopurinol 300mg  daily Continue Colchicine 0.6mg  daily, double dose for flare Has Prednisone as back up plan if new acute severe flare  Improving Lifestyle / dietary precautions given  Future referral vs other med options      Relevant Medications   allopurinol (ZYLOPRIM) 300 MG tablet   colchicine 0.6 MG tablet   Morbid obesity (HCC)   Relevant Medications   phentermine 37.5 MG capsule   Morbid Obesity Repeat Trial Phentermine 1-3 months, has tolerated well with success Future weight med options AVS, unsure if covered   Meds ordered this encounter  Medications   allopurinol (ZYLOPRIM) 300 MG tablet    Sig: Take 1 tablet (300 mg total) by mouth daily.    Dispense:  90 tablet    Refill:  3   colchicine 0.6 MG tablet    Sig: TAKE 1 TABLET BY MOUTH DAILY FOR PREVENTION. If acute flare may take 2 at once and repeat dose in 2 hours later, then take 1 daily    Dispense:  90 tablet    Refill:  3   phentermine 37.5 MG capsule    Sig: Take 1 capsule (37.5 mg total) by mouth every morning.    Dispense:  30 capsule    Refill:  0     Follow up plan: Return in about 9 months (around 07/31/2023) for 9 month fasting lab only then 1 week later Annual Physical.  Future labs ordered for March 2025   Saralyn Pilar, DO [facility] 10/31/2022, 8:37 AM

## 2022-11-01 ENCOUNTER — Other Ambulatory Visit: Payer: Self-pay | Admitting: Family Medicine

## 2022-11-01 DIAGNOSIS — M1A0710 Idiopathic chronic gout, right ankle and foot, without tophus (tophi): Secondary | ICD-10-CM

## 2022-11-01 DIAGNOSIS — Z0000 Encounter for general adult medical examination without abnormal findings: Secondary | ICD-10-CM

## 2022-11-01 DIAGNOSIS — E781 Pure hyperglyceridemia: Secondary | ICD-10-CM

## 2022-11-01 DIAGNOSIS — Z125 Encounter for screening for malignant neoplasm of prostate: Secondary | ICD-10-CM

## 2022-11-01 DIAGNOSIS — R7309 Other abnormal glucose: Secondary | ICD-10-CM

## 2022-11-01 NOTE — Assessment & Plan Note (Signed)
Chronic gout, improved uric acid level to 6.6 on inc dose allopurinol No breakthrough gout flares Multiple joints involved, various sites for flares Unable to tolerate higher dose Allopurinol 300mg  with side effect joint pain precipitated flare despite colchicine.  Known history of gout for years  Plan: Continue Allopurinol 300mg  daily Continue Colchicine 0.6mg  daily, double dose for flare Has Prednisone as back up plan if new acute severe flare  Improving Lifestyle / dietary precautions given  Future referral vs other med options

## 2022-11-01 NOTE — Assessment & Plan Note (Signed)
Improved A1c 6.2 down to 5.9 Reviewed PreDM Discussed lifestyle management diet exercise (but limited by gout)

## 2023-07-24 ENCOUNTER — Other Ambulatory Visit: Payer: Self-pay

## 2023-07-24 DIAGNOSIS — R7309 Other abnormal glucose: Secondary | ICD-10-CM

## 2023-07-24 DIAGNOSIS — Z125 Encounter for screening for malignant neoplasm of prostate: Secondary | ICD-10-CM

## 2023-07-24 DIAGNOSIS — M1A0710 Idiopathic chronic gout, right ankle and foot, without tophus (tophi): Secondary | ICD-10-CM

## 2023-07-24 DIAGNOSIS — Z0000 Encounter for general adult medical examination without abnormal findings: Secondary | ICD-10-CM

## 2023-07-24 DIAGNOSIS — E781 Pure hyperglyceridemia: Secondary | ICD-10-CM

## 2023-07-31 ENCOUNTER — Encounter: Payer: Self-pay | Admitting: Family Medicine

## 2023-11-28 ENCOUNTER — Other Ambulatory Visit: Payer: Self-pay | Admitting: Family Medicine

## 2023-11-28 DIAGNOSIS — M1A0710 Idiopathic chronic gout, right ankle and foot, without tophus (tophi): Secondary | ICD-10-CM

## 2023-11-28 NOTE — Telephone Encounter (Signed)
 Requested medication (s) are due for refill today - yes  Requested medication (s) are on the active medication list -yes  Future visit scheduled -no  Last refill: 10/31/22 #90 3RF  Notes to clinic: fails lab protocol- over 1 year- 07/11/22  Requested Prescriptions  Pending Prescriptions Disp Refills   colchicine  0.6 MG tablet [Pharmacy Med Name: COLCHICINE  0.6 MG TABLET] 90 tablet 3    Sig: TAKE 1 TABLET BY MOUTH DAILY FOR PREVENTION. If acute flare may take 2 at once and repeat dose in 2 hours later, then take 1 daily     Endocrinology:  Gout Agents - colchicine  Failed - 11/28/2023  3:55 PM      Failed - Cr in normal range and within 360 days    Creat  Date Value Ref Range Status  07/11/2022 1.24 0.60 - 1.29 mg/dL Final         Failed - ALT in normal range and within 360 days    ALT  Date Value Ref Range Status  07/11/2022 25 9 - 46 U/L Final         Failed - AST in normal range and within 360 days    AST  Date Value Ref Range Status  07/11/2022 20 10 - 40 U/L Final         Failed - Valid encounter within last 12 months    Recent Outpatient Visits   None            Failed - CBC within normal limits and completed in the last 12 months    WBC  Date Value Ref Range Status  07/11/2022 4.1 3.8 - 10.8 Thousand/uL Final   RBC  Date Value Ref Range Status  07/11/2022 4.66 4.20 - 5.80 Million/uL Final   Hemoglobin  Date Value Ref Range Status  07/11/2022 14.1 13.2 - 17.1 g/dL Final   HCT  Date Value Ref Range Status  07/11/2022 40.4 38.5 - 50.0 % Final   MCHC  Date Value Ref Range Status  07/11/2022 34.9 32.0 - 36.0 g/dL Final   [facility]  Date Value Ref Range Status  07/11/2022 30.3 27.0 - 33.0 pg Final   MCV  Date Value Ref Range Status  07/11/2022 86.7 80.0 - 100.0 fL Final   No results found for: PLTCOUNTKUC, LABPLAT, POCPLA RDW  Date Value Ref Range Status  07/11/2022 13.2 11.0 - 15.0 % Final            Requested Prescriptions  Pending  Prescriptions Disp Refills   colchicine  0.6 MG tablet [Pharmacy Med Name: COLCHICINE  0.6 MG TABLET] 90 tablet 3    Sig: TAKE 1 TABLET BY MOUTH DAILY FOR PREVENTION. If acute flare may take 2 at once and repeat dose in 2 hours later, then take 1 daily     Endocrinology:  Gout Agents - colchicine  Failed - 11/28/2023  3:55 PM      Failed - Cr in normal range and within 360 days    Creat  Date Value Ref Range Status  07/11/2022 1.24 0.60 - 1.29 mg/dL Final         Failed - ALT in normal range and within 360 days    ALT  Date Value Ref Range Status  07/11/2022 25 9 - 46 U/L Final         Failed - AST in normal range and within 360 days    AST  Date Value Ref Range Status  07/11/2022 20 10 - 40 U/L Final  Failed - Valid encounter within last 12 months    Recent Outpatient Visits   None            Failed - CBC within normal limits and completed in the last 12 months    WBC  Date Value Ref Range Status  07/11/2022 4.1 3.8 - 10.8 Thousand/uL Final   RBC  Date Value Ref Range Status  07/11/2022 4.66 4.20 - 5.80 Million/uL Final   Hemoglobin  Date Value Ref Range Status  07/11/2022 14.1 13.2 - 17.1 g/dL Final   HCT  Date Value Ref Range Status  07/11/2022 40.4 38.5 - 50.0 % Final   MCHC  Date Value Ref Range Status  07/11/2022 34.9 32.0 - 36.0 g/dL Final   [facility]  Date Value Ref Range Status  07/11/2022 30.3 27.0 - 33.0 pg Final   MCV  Date Value Ref Range Status  07/11/2022 86.7 80.0 - 100.0 fL Final   No results found for: PLTCOUNTKUC, LABPLAT, POCPLA RDW  Date Value Ref Range Status  07/11/2022 13.2 11.0 - 15.0 % Final

## 2023-12-07 ENCOUNTER — Other Ambulatory Visit: Payer: Self-pay | Admitting: Family Medicine

## 2023-12-07 DIAGNOSIS — M1A0710 Idiopathic chronic gout, right ankle and foot, without tophus (tophi): Secondary | ICD-10-CM

## 2023-12-08 NOTE — Telephone Encounter (Signed)
 Requested medications are due for refill today.  yes  Requested medications are on the active medications list.  yes  Last refill. 10/31/2022 #90 3   Future visit scheduled.   no  Notes to clinic.  Labs are expired.    Requested Prescriptions  Pending Prescriptions Disp Refills   allopurinol  (ZYLOPRIM ) 300 MG tablet [Pharmacy Med Name: ALLOPURINOL  300 MG TABLET] 90 tablet 3    Sig: TAKE 1 TABLET BY MOUTH EVERY DAY     Endocrinology:  Gout Agents - allopurinol  Failed - 12/08/2023  5:18 PM      Failed - Uric Acid in normal range and within 360 days    Uric Acid, Serum  Date Value Ref Range Status  10/24/2022 6.6 4.0 - 8.0 mg/dL Final    Comment:    Therapeutic target for gout patients: <6.0 mg/dL .          Failed - Cr in normal range and within 360 days    Creat  Date Value Ref Range Status  07/11/2022 1.24 0.60 - 1.29 mg/dL Final         Failed - Valid encounter within last 12 months    Recent Outpatient Visits   None            Failed - CBC within normal limits and completed in the last 12 months    WBC  Date Value Ref Range Status  07/11/2022 4.1 3.8 - 10.8 Thousand/uL Final   RBC  Date Value Ref Range Status  07/11/2022 4.66 4.20 - 5.80 Million/uL Final   Hemoglobin  Date Value Ref Range Status  07/11/2022 14.1 13.2 - 17.1 g/dL Final   HCT  Date Value Ref Range Status  07/11/2022 40.4 38.5 - 50.0 % Final   MCHC  Date Value Ref Range Status  07/11/2022 34.9 32.0 - 36.0 g/dL Final   [facility]  Date Value Ref Range Status  07/11/2022 30.3 27.0 - 33.0 pg Final   MCV  Date Value Ref Range Status  07/11/2022 86.7 80.0 - 100.0 fL Final   No results found for: PLTCOUNTKUC, LABPLAT, POCPLA RDW  Date Value Ref Range Status  07/11/2022 13.2 11.0 - 15.0 % Final

## 2024-01-28 ENCOUNTER — Ambulatory Visit: Payer: Self-pay

## 2024-01-28 ENCOUNTER — Other Ambulatory Visit: Payer: Self-pay | Admitting: Family Medicine

## 2024-01-28 DIAGNOSIS — M1A0710 Idiopathic chronic gout, right ankle and foot, without tophus (tophi): Secondary | ICD-10-CM

## 2024-01-28 NOTE — Telephone Encounter (Addendum)
 This RN made the 3rd attempt to contact patient. No answer, left voicemail with call back number. Will route to office for follow up.

## 2024-01-28 NOTE — Telephone Encounter (Signed)
 Copied from CRM [id number]. Topic: Clinical - Prescription Issue >> Jan 28, 2024  3:35 PM Precious C wrote: Reason for CRM: Pt is requesting colchicine  0.6 MG tablet, as he is having a gout attack, is requesting RX be sent to pharmacy as soon as possbile, he states he has no medication left.   Attempted to reach pt: called: no answer: left voicemail

## 2024-01-29 NOTE — Telephone Encounter (Signed)
 Requested medications are due for refill today.  yes  Requested medications are on the active medications list.  yes  Last refill. 10/31/2022 #90 3 rf  Future visit scheduled.   yes  Notes to clinic.  Labs are expired.    Requested Prescriptions  Pending Prescriptions Disp Refills   colchicine  0.6 MG tablet [Pharmacy Med Name: COLCHICINE  0.6 MG TABLET] 90 tablet 3    Sig: TAKE 1 TABLET BY MOUTH DAILY FOR PREVENTION. If acute flare may take 2 at once and repeat dose in 2 hours later, then take 1 daily     Endocrinology:  Gout Agents - colchicine  Failed - 01/29/2024  2:54 PM      Failed - Cr in normal range and within 360 days    Creat  Date Value Ref Range Status  07/11/2022 1.24 0.60 - 1.29 mg/dL Final         Failed - ALT in normal range and within 360 days    ALT  Date Value Ref Range Status  07/11/2022 25 9 - 46 U/L Final         Failed - AST in normal range and within 360 days    AST  Date Value Ref Range Status  07/11/2022 20 10 - 40 U/L Final         Failed - Valid encounter within last 12 months    Recent Outpatient Visits   None            Failed - CBC within normal limits and completed in the last 12 months    WBC  Date Value Ref Range Status  07/11/2022 4.1 3.8 - 10.8 Thousand/uL Final   RBC  Date Value Ref Range Status  07/11/2022 4.66 4.20 - 5.80 Million/uL Final   Hemoglobin  Date Value Ref Range Status  07/11/2022 14.1 13.2 - 17.1 g/dL Final   HCT  Date Value Ref Range Status  07/11/2022 40.4 38.5 - 50.0 % Final   MCHC  Date Value Ref Range Status  07/11/2022 34.9 32.0 - 36.0 g/dL Final   [facility]  Date Value Ref Range Status  07/11/2022 30.3 27.0 - 33.0 pg Final   MCV  Date Value Ref Range Status  07/11/2022 86.7 80.0 - 100.0 fL Final   No results found for: PLTCOUNTKUC, LABPLAT, POCPLA RDW  Date Value Ref Range Status  07/11/2022 13.2 11.0 - 15.0 % Final

## 2024-01-29 NOTE — Telephone Encounter (Signed)
 Left message for patient to return call OK to schedule a sooner appointment with Dr Edman

## 2024-02-04 ENCOUNTER — Encounter: Payer: Self-pay | Admitting: Family Medicine

## 2024-02-04 ENCOUNTER — Ambulatory Visit: Admitting: Family Medicine

## 2024-02-04 ENCOUNTER — Other Ambulatory Visit: Payer: Self-pay | Admitting: Family Medicine

## 2024-02-04 VITALS — BP 118/82 | HR 62 | Ht 72.0 in | Wt 302.4 lb

## 2024-02-04 DIAGNOSIS — Z6841 Body Mass Index (BMI) 40.0 and over, adult: Secondary | ICD-10-CM | POA: Diagnosis not present

## 2024-02-04 DIAGNOSIS — M109 Gout, unspecified: Secondary | ICD-10-CM

## 2024-02-04 DIAGNOSIS — M1A0710 Idiopathic chronic gout, right ankle and foot, without tophus (tophi): Secondary | ICD-10-CM

## 2024-02-04 DIAGNOSIS — Z0000 Encounter for general adult medical examination without abnormal findings: Secondary | ICD-10-CM

## 2024-02-04 DIAGNOSIS — E781 Pure hyperglyceridemia: Secondary | ICD-10-CM

## 2024-02-04 DIAGNOSIS — Z125 Encounter for screening for malignant neoplasm of prostate: Secondary | ICD-10-CM

## 2024-02-04 DIAGNOSIS — R7309 Other abnormal glucose: Secondary | ICD-10-CM

## 2024-02-04 MED ORDER — COLCHICINE 0.6 MG PO TABS: 0.6000 mg | ORAL_TABLET | Freq: Two times a day (BID) | ORAL | 3 refills | Status: AC

## 2024-02-04 MED ORDER — PREDNISONE 20 MG PO TABS: ORAL_TABLET | ORAL | 1 refills | Status: AC

## 2024-02-04 NOTE — Progress Notes (Signed)
 Subjective:    Patient ID: Benjamin Mccarty, male    DOB: 01-13-1981, 43 y.o.   MRN: [id number]  Benjamin Mccarty is a 43 y.o. male presenting on 02/04/2024 for Gout   HPI  Discussed the use of AI scribe software for clinical note transcription with the patient, who gave verbal consent to proceed.  History of Present Illness   Benjamin Mccarty is a 43 year old male with gout who presents with acute right knee pain.  Acute right knee pain and gout flare - Acute right knee pain ongoing for the past couple of weeks - Pain is persistent and worsens at night and after periods of rest - Severity of pain disrupts sleep - Pain sometimes radiates with 'pins and needles' sensation and numbness extending from knee to foot - No recent fever - Identifies dietary triggers including seafood and acidic foods that exacerbate symptoms  Medication access and response - Difficulty obtaining colchicine  due to prescription denial since >1 year since last visit - Managing pain primarily with ibuprofen, but prefers to avoid frequent use - Previously took allopurinol  daily (300 mg), which helped prevent flare-ups when taken consistently with colchicine  - Recently began taking allopurinol  every other day due to running low on medication, coinciding with return of knee pain - Previous use of prednisone  for gout flares with successful symptom control  Weight and metabolic risk factors - Current weight approximately 300 pounds - Family history of diabetes - Previously tried phentermine  for weight loss with some success      - He has not seen Rheumatologist - Additional labs done showed positive inflammatory markers ESR 17 and CRP 16.4, otherwise negative rheumatological screening RF, Anti CCP, ANA tests. He has fam history of rheumatological conditions with gout, RA and Lupus.      02/04/2024   10:44 PM 10/31/2022    8:34 AM 07/18/2022    8:38 AM  Depression screen PHQ 2/9  Decreased Interest 0  1 1  Down, Depressed, Hopeless 0 0 0  PHQ - 2 Score 0 1 1  Altered sleeping  0 0  Tired, decreased energy  1 2  Change in appetite  0 0  Feeling bad or failure about yourself   0 0  Trouble concentrating  0 0  Moving slowly or fidgety/restless  0 0  Suicidal thoughts  0 0  PHQ-9 Score  2 3  Difficult doing work/chores   Not difficult at all       10/31/2022    8:34 AM 07/18/2022    8:39 AM  GAD 7 : Generalized Anxiety Score  Nervous, Anxious, on Edge 0 0  Control/stop worrying 0 0  Worry too much - different things 0 0  Trouble relaxing 0 0  Restless 0 0  Easily annoyed or irritable 0 1  Afraid - awful might happen 0 0  Total GAD 7 Score 0 1  Anxiety Difficulty  Not difficult at all    Social History   Tobacco Use   Smoking status: Never   Smokeless tobacco: Never  Vaping Use   Vaping status: Never Used  Substance Use Topics   Alcohol use: No   Drug use: Never    Review of Systems Per HPI unless specifically indicated above     Objective:    BP 118/82 (BP Location: Left Arm, Patient Position: Sitting, Cuff Size: Large)   Pulse 62   Ht 6' (1.829 m)   Wt (!) 302 lb 6  oz (137.2 kg)   SpO2 97%   BMI 41.01 kg/m   Wt Readings from Last 3 Encounters:  02/04/24 (!) 302 lb 6 oz (137.2 kg)  10/31/22 290 lb (131.5 kg)  08/05/22 293 lb (132.9 kg)    Physical Exam Vitals and nursing note reviewed.  Constitutional:      General: He is not in acute distress.    Appearance: Normal appearance. He is well-developed. He is not diaphoretic.     Comments: Well-appearing, comfortable, cooperative  HENT:     Head: Normocephalic and atraumatic.  Eyes:     General:        Right eye: No discharge.        Left eye: No discharge.     Conjunctiva/sclera: Conjunctivae normal.  Cardiovascular:     Rate and Rhythm: Normal rate.  Pulmonary:     Effort: Pulmonary effort is normal.  Musculoskeletal:     Comments: R knee warmth swelling reduced ROM due to tenderness  Skin:     General: Skin is warm and dry.     Findings: No erythema or rash.  Neurological:     Mental Status: He is alert and oriented to person, place, and time.  Psychiatric:        Mood and Affect: Mood normal.        Behavior: Behavior normal.        Thought Content: Thought content normal.     Comments: Well groomed, good eye contact, normal speech and thoughts     Results for orders placed or performed in visit on 10/24/22  Uric acid   Collection Time: 10/24/22  7:54 AM  Result Value Ref Range   Uric Acid, Serum 6.6 4.0 - 8.0 mg/dL  TSH   Collection Time: 10/24/22  7:54 AM  Result Value Ref Range   TSH 3.26 0.40 - 4.50 mIU/L  Hemoglobin A1c   Collection Time: 10/24/22  7:54 AM  Result Value Ref Range   Hgb A1c MFr Bld 5.9 (H) <5.7 % of total Hgb   Mean Plasma Glucose 123 mg/dL   eAG (mmol/L) 6.8 mmol/L      Assessment & Plan:   Problem List Items Addressed This Visit     Gout, chronic   Relevant Medications   colchicine  0.6 MG tablet   Morbid obesity with BMI of 40.0-44.9, adult (HCC)   Other Visit Diagnoses       Acute gout of right knee, unspecified cause    -  Primary   Relevant Medications   colchicine  0.6 MG tablet   predniSONE  (DELTASONE ) 20 MG tablet        Subacute gout flare, right knee Subacute gout flare in the right knee persisting for weeks. Unable to obtain colchicine  last visit >1 year ago so refill was not authorized by call center. Managing pain with ibuprofen.   Discussed prednisone  for acute management and colchicine  for future flares. Considered rheumatology referral. - Prescribe prednisone  for a one-week taper. - Renew colchicine  prescription. - Advise prednisone  taper and colchicine  use post-prednisone  if needed. - Discuss rheumatology referral. I do recommend pursuing referral at this point given chronicity and severity of his gout diagnosis, especially if taking high dose of therapy with Allopurinol  300mg  daily along with colchicine  dailyi  for prophylaxis and has experienced flare off of med  Chronic right knee pain Chronic knee pain exacerbated by gout. Weight loss may help. Considered rheumatology referral. - Consider rheumatology referral for chronic knee pain evaluation.  Morbid  Obesity BMI >41 Obesity at 300 lbs. Previously tried phentermine . Discussed Contrave for weight loss, addressing cravings and appetite, and cost considerations. - Discuss Contrave as a weight loss option, $99/month program. - Provide Contrave information. - Encourage weight loss to reduce knee pressure.  Prediabetes Prediabetes with family history of diabetes. Monitoring weight. Plan for follow-up blood work. - Plan follow-up blood work for glucose and markers.  General Health Maintenance Discussed coronary calcium score for heart screening in patients over 40 with risk factors. - Offer coronary calcium score screening.        No orders of the defined types were placed in this encounter.   Meds ordered this encounter  Medications   colchicine  0.6 MG tablet    Sig: Take 1 tablet (0.6 mg total) by mouth 2 (two) times daily. For gout prevention    Dispense:  180 tablet    Refill:  3   predniSONE  (DELTASONE ) 20 MG tablet    Sig: Take daily with food. Start with 60mg  (3 pills) x 2 days, then reduce to 40mg  (2 pills) x 2 days, then 20mg  (1 pill) x 3 days    Dispense:  13 tablet    Refill:  1    Add 1 refill for future back up plan    Follow up plan: Return in about 4 weeks (around 03/03/2024) for 4-6 weeks fasting lab > 1 week later Annual Physical.  Future labs ordered for 03/11/24   Marsa Officer, DO [facility] 02/04/2024, 8:53 AM

## 2024-02-04 NOTE — Patient Instructions (Addendum)
 Thank you for coming to the office today.  Start Prednisone  taper and when done you can go back to your Colchicine   Re order Colchicine  for twice a day, can start with ONCE daily for prevention and add 2nd dose for flares only.  Consider Rheumatology for future.  ----------- Consider 1 week sample Contrave - Week 1: 1 pill daily with meal - Week 2: 1 pill twice a day with meal - Week 3: 2 pill with meal in AM and 1 pill with PM meal - Week 4: 2 pill twice a day with meal   Please notify me after 1 week, and if doing well on Contrave, I can order to [facility] $99 per month if you sign up to their [employer] (regardless of insurance coverage)  Instructions to setup Savings Program  [email or web address]  Step 1  Create an account with our partner pharmacy, [facility].   Online: Enroll online at [email or web address]  OR  By phone: Call [phone] to speak with a member of the [facility] staff  [facility] ?[address] [place] [address] ------------------------------------------------    Please schedule a Follow-up Appointment to: Return in about 4 weeks (around 03/03/2024) for 4-6 weeks fasting lab > 1 week later Annual Physical.  If you have any other questions or concerns, please feel free to call the office or send a message through MyChart. You may also schedule an earlier appointment if necessary.  Additionally, you may be receiving a survey about your experience at our office within a few days to 1 week by e-mail or mail. We value your feedback.  Marsa Officer, DO [facility], [place]

## 2024-03-11 ENCOUNTER — Other Ambulatory Visit

## 2024-03-11 DIAGNOSIS — Z125 Encounter for screening for malignant neoplasm of prostate: Secondary | ICD-10-CM

## 2024-03-11 DIAGNOSIS — E781 Pure hyperglyceridemia: Secondary | ICD-10-CM

## 2024-03-11 DIAGNOSIS — R7309 Other abnormal glucose: Secondary | ICD-10-CM

## 2024-03-11 DIAGNOSIS — Z0000 Encounter for general adult medical examination without abnormal findings: Secondary | ICD-10-CM

## 2024-03-11 DIAGNOSIS — M1A0710 Idiopathic chronic gout, right ankle and foot, without tophus (tophi): Secondary | ICD-10-CM

## 2024-03-19 ENCOUNTER — Encounter: Admitting: Family Medicine

## 2024-05-01 ENCOUNTER — Other Ambulatory Visit: Payer: Self-pay | Admitting: Family Medicine

## 2024-05-01 DIAGNOSIS — M1A0710 Idiopathic chronic gout, right ankle and foot, without tophus (tophi): Secondary | ICD-10-CM

## 2024-05-05 NOTE — Telephone Encounter (Signed)
 Requested Prescriptions  Pending Prescriptions Disp Refills   allopurinol  (ZYLOPRIM ) 300 MG tablet [Pharmacy Med Name: ALLOPURINOL  300 MG TABLET] 90 tablet 0    Sig: TAKE 1 TABLET BY MOUTH EVERY DAY     Endocrinology:  Gout Agents - allopurinol  Failed - 05/05/2024  9:23 AM      Failed - Uric Acid in normal range and within 360 days    Uric Acid, Serum  Date Value Ref Range Status  10/24/2022 6.6 4.0 - 8.0 mg/dL Final    Comment:    Therapeutic target for gout patients: <6.0 mg/dL .          Failed - Cr in normal range and within 360 days    Creat  Date Value Ref Range Status  07/11/2022 1.24 0.60 - 1.29 mg/dL Final         Failed - CBC within normal limits and completed in the last 12 months    WBC  Date Value Ref Range Status  07/11/2022 4.1 3.8 - 10.8 Thousand/uL Final   RBC  Date Value Ref Range Status  07/11/2022 4.66 4.20 - 5.80 Million/uL Final   Hemoglobin  Date Value Ref Range Status  07/11/2022 14.1 13.2 - 17.1 g/dL Final   HCT  Date Value Ref Range Status  07/11/2022 40.4 38.5 - 50.0 % Final   MCHC  Date Value Ref Range Status  07/11/2022 34.9 32.0 - 36.0 g/dL Final   [facility]  Date Value Ref Range Status  07/11/2022 30.3 27.0 - 33.0 pg Final   MCV  Date Value Ref Range Status  07/11/2022 86.7 80.0 - 100.0 fL Final   No results found for: PLTCOUNTKUC, LABPLAT, POCPLA RDW  Date Value Ref Range Status  07/11/2022 13.2 11.0 - 15.0 % Final         Passed - Valid encounter within last 12 months    Recent Outpatient Visits           3 months ago Acute gout of right knee, unspecified cause   [facility] [place], Marsa PARAS, DO
